# Patient Record
Sex: Female | Born: 2000 | Race: White | Hispanic: No | Marital: Single | Smoking: Never smoker
Health system: Southern US, Community
[De-identification: ages and names within clinical notes are randomized; demographics above are authoritative.]

## PROBLEM LIST (undated history)

## (undated) DIAGNOSIS — L709 Acne, unspecified: Secondary | ICD-10-CM

## (undated) DIAGNOSIS — K5900 Constipation, unspecified: Secondary | ICD-10-CM

## (undated) DIAGNOSIS — K644 Residual hemorrhoidal skin tags: Secondary | ICD-10-CM

## (undated) HISTORY — DX: Residual hemorrhoidal skin tags: K64.4

## (undated) HISTORY — DX: Acne, unspecified: L70.9

## (undated) HISTORY — DX: Constipation, unspecified: K59.00

## (undated) HISTORY — PX: TONSILLECTOMY AND ADENOIDECTOMY: SHX28

## (undated) HISTORY — PX: GUM SURGERY: SHX658

---
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
## 2018-08-08 ENCOUNTER — Telehealth: Payer: Self-pay | Admitting: Family Medicine

## 2018-08-08 NOTE — Telephone Encounter (Signed)
She is NOT up to date on vaccines. Please schedule physical in office as long as she does not have cold symptoms.

## 2018-08-08 NOTE — Telephone Encounter (Signed)
Patients mom called to see if patient is due any immunizations at this time. Can you advise and I will give her a call back to let her know.  Thank You

## 2018-08-08 NOTE — Telephone Encounter (Signed)
Per patient no cold symptoms and not around anyone as far as she knows with COVID 19  Scheduled 08-14-18 @ 2:00 with Dr Laural Benes

## 2018-08-13 ENCOUNTER — Other Ambulatory Visit: Payer: Self-pay | Admitting: Family Medicine

## 2018-08-14 ENCOUNTER — Other Ambulatory Visit: Payer: Self-pay

## 2018-08-14 ENCOUNTER — Ambulatory Visit (INDEPENDENT_AMBULATORY_CARE_PROVIDER_SITE_OTHER): Payer: BC Managed Care – PPO | Admitting: Family Medicine

## 2018-08-14 ENCOUNTER — Encounter: Payer: Self-pay | Admitting: Family Medicine

## 2018-08-14 VITALS — BP 110/74 | HR 80 | Temp 99.5°F | Ht 67.4 in | Wt 119.8 lb

## 2018-08-14 DIAGNOSIS — Z0000 Encounter for general adult medical examination without abnormal findings: Secondary | ICD-10-CM

## 2018-08-14 DIAGNOSIS — Z23 Encounter for immunization: Secondary | ICD-10-CM | POA: Diagnosis not present

## 2018-08-14 NOTE — Patient Instructions (Addendum)
Tdap Vaccine (Tetanus, Diphtheria and Pertussis): What You Need to Know 1. Why get vaccinated? Tetanus, diphtheria and pertussis are very serious diseases. Tdap vaccine can protect [place] from these diseases. And, Tdap vaccine given to pregnant women can protect newborn babies against pertussis.Marland Kitchen TETANUS (Lockjaw) is rare in the [place] today. It causes painful muscle tightening and stiffness, usually all over the body.  It can lead to tightening of muscles in the head and neck so you can't open your mouth, swallow, or sometimes even breathe. Tetanus kills about 1 out of 10 people who are infected even after receiving the best medical care. DIPHTHERIA is also rare in the [place] today. It can cause a thick coating to form in the back of the throat.  It can lead to breathing problems, heart failure, paralysis, and death. PERTUSSIS (Whooping Cough) causes severe coughing spells, which can cause difficulty breathing, vomiting and disturbed sleep.  It can also lead to weight loss, incontinence, and rib fractures. Up to 2 in 100 adolescents and 5 in 100 adults with pertussis are hospitalized or have complications, which could include pneumonia or death. These diseases are caused by bacteria. Diphtheria and pertussis are spread from person to person through secretions from coughing or sneezing. Tetanus enters the body through cuts, scratches, or wounds. Before vaccines, as many as 200,000 cases of diphtheria, 200,000 cases of pertussis, and hundreds of cases of tetanus, were reported in the [place] each year. Since vaccination began, reports of cases for tetanus and diphtheria have dropped by about 99% and for pertussis by about 80%. 2. Tdap vaccine Tdap vaccine can protect adolescents and adults from tetanus, diphtheria, and pertussis. One dose of Tdap is routinely given at age 61 or 77. People who did not get Tdap at that age should get it as soon as possible. Tdap is especially important  for healthcare professionals and anyone having close contact with a baby younger than 12 months. Pregnant women should get a dose of Tdap during every pregnancy, to protect the newborn from pertussis. Infants are most at risk for severe, life-threatening complications from pertussis. Another vaccine, called Td, protects against tetanus and diphtheria, but not pertussis. A Td booster should be given every 10 years. Tdap may be given as one of these boosters if you have never gotten Tdap before. Tdap may also be given after a severe cut or burn to prevent tetanus infection. Your doctor or the person giving you the vaccine can give you more information. Tdap may safely be given at the same time as other vaccines. 3. Some people should not get this vaccine  A person who has ever had a life-threatening allergic reaction after a previous dose of any diphtheria, tetanus or pertussis containing vaccine, OR has a severe allergy to any part of this vaccine, should not get Tdap vaccine. Tell the person giving the vaccine about any severe allergies.  Anyone who had coma or long repeated seizures within 7 days after a childhood dose of DTP or DTaP, or a previous dose of Tdap, should not get Tdap, unless a cause other than the vaccine was found. They can still get Td.  Talk to your doctor if you: ? have seizures or another nervous system problem, ? had severe pain or swelling after any vaccine containing diphtheria, tetanus or pertussis, ? ever had a condition called Guillain-Barr Syndrome (GBS), ? aren't feeling well on the day the shot is scheduled. 4. Risks With any medicine, including vaccines, there is  a chance of side effects. These are usually mild and go away on their own. Serious reactions are also possible but are rare. Most people who get Tdap vaccine do not have any problems with it. Mild problems following Tdap (Did not interfere with activities)  Pain where the shot was given (about 3 in 4  adolescents or 2 in 3 adults)  Redness or swelling where the shot was given (about 1 person in 5)  Mild fever of at least 100.40F (up to about 1 in 25 adolescents or 1 in 100 adults)  Headache (about 3 or 4 people in 10)  Tiredness (about 1 person in 3 or 4)  Nausea, vomiting, diarrhea, stomach ache (up to 1 in 4 adolescents or 1 in 10 adults)  Chills, sore joints (about 1 person in 10)  Body aches (about 1 person in 3 or 4)  Rash, swollen glands (uncommon) Moderate problems following Tdap (Interfered with activities, but did not require medical attention)  Pain where the shot was given (up to 1 in 5 or 6)  Redness or swelling where the shot was given (up to about 1 in 16 adolescents or 1 in 12 adults)  Fever over 102F (about 1 in 100 adolescents or 1 in 250 adults)  Headache (about 1 in 7 adolescents or 1 in 10 adults)  Nausea, vomiting, diarrhea, stomach ache (up to 1 or 3 people in 100)  Swelling of the entire arm where the shot was given (up to about 1 in 500). Severe problems following Tdap (Unable to perform usual activities; required medical attention)  Swelling, severe pain, bleeding and redness in the arm where the shot was given (rare). Problems that could happen after any vaccine:  People sometimes faint after a medical procedure, including vaccination. Sitting or lying down for about 15 minutes can help prevent fainting, and injuries caused by a fall. Tell your doctor if you feel dizzy, or have vision changes or ringing in the ears.  Some people get severe pain in the shoulder and have difficulty moving the arm where a shot was given. This happens very rarely.  Any medication can cause a severe allergic reaction. Such reactions from a vaccine are very rare, estimated at fewer than 1 in a million doses, and would happen within a few minutes to a few hours after the vaccination. As with any medicine, there is a very remote chance of a vaccine causing a serious  injury or death. The safety of vaccines is always being monitored. For more information, visit: http://www.aguilar.org/ 5. What if there is a serious problem? What should I look for?  Look for anything that concerns you, such as signs of a severe allergic reaction, very high fever, or unusual behavior. Signs of a severe allergic reaction can include hives, swelling of the face and throat, difficulty breathing, a fast heartbeat, dizziness, and weakness. These would usually start a few minutes to a few hours after the vaccination. What should I do?  If you think it is a severe allergic reaction or other emergency that can't wait, call 9-1-1 or get the person to the nearest hospital. Otherwise, call your doctor.  Afterward, the reaction should be reported to the Vaccine Adverse Event Reporting System (VAERS). Your doctor might file this report, or you can do it yourself through the VAERS web site at www.vaers.SamedayNews.es, or by calling 302-685-4097. VAERS does not give medical advice. 6. The National Vaccine Injury Compensation Program The [employer] Vaccine Injury Compensation Program ([place]) is  a federal program that was created to compensate people who may have been injured by certain vaccines. Persons who believe they may have been injured by a vaccine can learn about the program and about filing a claim by calling (347)592-6592 or visiting the [place] website at GoldCloset.com.ee. There is a time limit to file a claim for compensation. 7. How can I learn more?  Ask your doctor. He or she can give you the vaccine package insert or suggest other sources of information.  Call your local or state health department.  Contact the Centers for Disease Control and Prevention (CDC): ? Call 705-124-9657 (1-800-CDC-INFO) or ? Visit CDC's website at http://hunter.com/ Vaccine Information Statement Tdap Vaccine (07/02/2013) This information is not intended to replace advice given to you  by your health care provider. Make sure you discuss any questions you have with your health care provider. Document Released: 10/25/2011 Document Revised: 12/11/2017 Document Reviewed: 12/11/2017 Elsevier Interactive Patient Education  2019 [place]. Hepatitis A Vaccine: What You Need to Know 1. Why get vaccinated? Hepatitis A is a serious liver disease. It is caused by the hepatitis A virus (HAV). HAV is spread from person to person through contact with the feces (stool) of people who are infected, which can easily happen if someone does not wash his or her hands properly. You can also get hepatitis A from food, water, or objects contaminated with HAV. Symptoms of hepatitis A can include:  fever, fatigue, loss of appetite, nausea, vomiting, and/or joint pain  severe stomach pains and diarrhea (mainly in children), or  jaundice (yellow skin or eyes, dark urine, clay-colored bowel movements). These symptoms usually appear 2 to 6 weeks after exposure and usually last less than 2 months, although some people can be ill for as long as 6 months. If you have hepatitis A you may be too ill to work. Children often do not have symptoms, but most adults do. You can spread HAV without having symptoms. Hepatitis A can cause liver failure and death, although this is rare and occurs more commonly in persons 1 years of age or older and persons with other liver diseases, such as hepatitis B or C. Hepatitis A vaccine can prevent hepatitis A. Hepatitis A vaccines were recommended in the [place] beginning in 1996. Since then, the number of cases reported each year in the [place] has dropped from around 31,000 cases to fewer than 1,500 cases. 2. Hepatitis A vaccine Hepatitis A vaccine is an inactivated (killed) vaccine. You will need 2 doses for long-lasting protection. These doses should be given at least 6 months apart. Children are routinely vaccinated between their first and second birthdays (67 through  83 months of age). Older children and adolescents can get the vaccine after 23 months. Adults who have not been vaccinated previously and want to be protected against hepatitis A can also get the vaccine. You should get hepatitis A vaccine if you:  are traveling to countries where hepatitis A is common,  are a man who has sex with other men,  use illegal drugs,  have a chronic liver disease such as hepatitis B or hepatitis C,  are being treated with clotting-factor concentrates,  work with hepatitis A-infected animals or in a hepatitis A research laboratory, or  expect to have close personal contact with an international adoptee from a country where hepatitis A is common Ask your healthcare provider if you want more information about any of these groups. There are no known risks to getting hepatitis  A vaccine at the same time as other vaccines. 3. Some people should not get this vaccine Tell the person who is giving you the vaccine:  If you have any severe, life-threatening allergies. If you ever had a life-threatening allergic reaction after a dose of hepatitis A vaccine, or have a severe allergy to any part of this vaccine, you may be advised not to get vaccinated. Ask your health care provider if you want information about vaccine components.  If you are not feeling well. If you have a mild illness, such as a cold, you can probably get the vaccine today. If you are moderately or severely ill, you should probably wait until you recover. Your doctor can advise you. 4. Risks of a vaccine reaction With any medicine, including vaccines, there is a chance of side effects. These are usually mild and go away on their own, but serious reactions are also possible. Most people who get hepatitis A vaccine do not have any problems with it. Minor problems following hepatitis A vaccine include:  soreness or redness where the shot was given  low-grade fever  headache  tiredness If these  problems occur, they usually begin soon after the shot and last 1 or 2 days. Your doctor can tell you more about these reactions. Other problems that could happen after this vaccine:  People sometimes faint after a medical procedure, including vaccination. Sitting or lying down for about 15 minutes can help prevent fainting, and injuries caused by a fall. Tell your provider if you feel dizzy, or have vision changes or ringing in the ears.  Some people get shoulder pain that can be more severe and longer lasting than the more routine soreness that can follow injections. This happens very rarely.  Any medication can cause a severe allergic reaction. Such reactions from a vaccine are very rare, estimated at about 1 in a million doses, and would happen within a few minutes to a few hours after the vaccination. As with any medicine, there is a very remote chance of a vaccine causing a serious injury or death. The safety of vaccines is always being monitored. For more information, visit: http://www.aguilar.org/ 5. What if there is a serious problem? What should I look for?  Look for anything that concerns you, such as signs of a severe allergic reaction, very high fever, or unusual behavior. Signs of a severe allergic reaction can include hives, swelling of the face and throat, difficulty breathing, a fast heartbeat, dizziness, and weakness. These would start a few minutes to a few hours after the vaccination. What should I do?  If you think it is a severe allergic reaction or other emergency that can't wait, call 9-1-1 or get to the nearest hospital. Otherwise, call your clinic. Afterward, the reaction should be reported to the Vaccine Adverse Event Reporting System (VAERS). Your doctor should file this report, or you can do it yourself through the VAERS web site at www.vaers.SamedayNews.es, or by calling 347-414-2868. VAERS does not give medical advice. 6. The National Vaccine Injury Compensation  Program The [employer] Vaccine Injury Compensation Program (VICP) is a federal program that was created to compensate people who may have been injured by certain vaccines. Persons who believe they may have been injured by a vaccine can learn about the program and about filing a claim by calling (973)832-3173 or visiting the [place] website at GoldCloset.com.ee. There is a time limit to file a claim for compensation. 7. How can I learn more?  Ask your  healthcare provider. He or she can give you the vaccine package insert or suggest other sources of information.  Call your local or state health department.  Contact the Centers for Disease Control and Prevention (CDC): ? Call 845-818-6322 (1-800-CDC-INFO) or ? Visit CDC's website at http://hunter.com/ CDC Vaccine Information Statement Hepatitis A Vaccine (11/26/2014) This information is not intended to replace advice given to you by your health care provider. Make sure you discuss any questions you have with your health care provider. Document Released: 02/17/2006 Document Revised: 12/05/2017 Document Reviewed: 12/05/2017 Elsevier Interactive Patient Education  2019 [place]. Varicella (Chickenpox) Vaccine: What You Need to Know 1. Why get vaccinated? Varicella vaccine can prevent chickenpox. Chickenpox can cause an itchy rash that usually lasts about a week. It can also cause fever, tiredness, loss of appetite, and headache. It can lead to skin infections, pneumonia, inflammation of the blood vessels, and swelling of the brain and/or spinal cord covering, and infections of the bloodstream, bone, or joints. Some people who get chickenpox get a painful rash called shingles (also known as herpes zoster) years later. Chickenpox is usually mild but it can be serious in infants under 10 months of age, adolescents, adults, pregnant women, and people with a weakened immune system. Some people get so sick that they need to be  hospitalized. It doesn't happen often, but people can die from chickenpox. Most people who are vaccinated with 2 doses of varicella vaccine will be protected for life. 2. Varicella vaccine Children need 2 doses of varicella vaccine, usually:  First dose: 12 through 11 months of age  Second dose: 4 through 30 years of age Older children, adolescents, and adults also need 2 doses of varicella vaccine if they are not already immune to chickenpox. Varicella vaccine may be given at the same time as other vaccines. Also, a child between 53 months and 95 years of age might receive varicella vaccine together with MMR (measles, mumps, and rubella) vaccine in a single shot, known as MMRV. Your health care provider can give you more information. 3. Talk with your health care provider Tell your vaccine provider if the person getting the vaccine:  Has had an allergic reaction after a previous dose of varicella vaccine, or has any severe, life-threatening allergies.  Is pregnant, or thinks she might be pregnant.  Has a weakened immune system, or has a parent, brother, or sister with a history of hereditary or congenital immune system problems.  Is taking salicylates (such as aspirin).  Has recently had a blood transfusion or received other blood products.  Has tuberculosis.  Has gotten any other vaccines in the past 4 weeks. In some cases, your health care provider may decide to postpone varicella vaccination to a future visit. People with minor illnesses, such as a cold, may be vaccinated. People who are moderately or severely ill should usually wait until they recover before getting varicella vaccine. Your health care provider can give you more information. 4. Risks of a vaccine reaction  Sore arm from the injection, fever, or redness or rash where the shot is given can happen after varicella vaccine.  More serious reactions happen very rarely. These can include pneumonia, infection of the brain  and/or spinal cord covering, or seizures that are often associated with fever.  In people with serious immune system problems, this vaccine may cause an infection which may be life-threatening. People with serious immune system problems should not get varicella vaccine. It is possible for a vaccinated person to  develop a rash. If this happens, the varicella vaccine virus could be spread to an unprotected person. Anyone who gets a rash should stay away from people with a weakened immune system and infants until the rash goes away. Talk with your health care provider to learn more. Some people who are vaccinated against chickenpox get shingles (herpes zoster) years later. This is much less common after vaccination than after chickenpox disease. People sometimes faint after medical procedures, including vaccination. Tell your provider if you feel dizzy or have vision changes or ringing in the ears. As with any medicine, there is a very remote chance of a vaccine causing a severe allergic reaction, other serious injury, or death. 5. What if there is a serious problem? An allergic reaction could occur after the vaccinated person leaves the clinic. If you see signs of a severe allergic reaction (hives, swelling of the face and throat, difficulty breathing, a fast heartbeat, dizziness, or weakness), call 9-1-1 and get the person to the nearest hospital. For other signs that concern you, call your health care provider. Adverse reactions should be reported to the Vaccine Adverse Event Reporting System (VAERS). Your health care provider will usually file this report, or you can do it yourself. Visit the VAERS website at www.vaers.SamedayNews.es or call (360) 298-1357.VAERS is only for reporting reactions, and VAERS staff do not give medical advice. 6. The National Vaccine Injury Compensation Program The [employer] Vaccine Injury Compensation Program (VICP) is a federal program that was created to compensate people who may  have been injured by certain vaccines. Visit the VICP website at GoldCloset.com.ee or call 626-214-1748 to learn about the program and about filing a claim. There is a time limit to file a claim for compensation. 7. How can I learn more?  Ask your healthcare provider.  Call your local or state health department.  Contact the Centers for Disease Control and Prevention (CDC): ? Call 682-112-0671 (1-800-CDC-INFO) or ? Visit CDC's http://hunter.com/ Vaccine Information Statement (Interim) Varicella Vaccine (12/21/2017) This information is not intended to replace advice given to you by your health care provider. Make sure you discuss any questions you have with your health care provider. Document Released: 02/17/2006 Document Revised: 12/27/2017 Document Reviewed: 12/27/2017 Elsevier Interactive Patient Education  2019 [employer] Inc. HPV (Human Papillomavirus) Vaccine: What You Need to Know 1. Why get vaccinated? HPV vaccine prevents infection with human papillomavirus (HPV) types that are associated with many cancers, including:  cervical cancer in females,  vaginal and vulvar cancers in females,  anal cancer in females and males,  throat cancer in females and males, and  penile cancer in males. In addition, HPV vaccine prevents infection with HPV types that cause genital warts in both females and males. In the [place] about 12,000 women get cervical cancer every year, and about 4,000 women die from it. HPV vaccine can prevent most of these cases of cervical cancer. Vaccination is not a substitute for cervical cancer screening. This vaccine does not protect against all HPV types that can cause cervical cancer. Women should still get regular Pap tests. HPV infection usually comes from sexual contact, and most people will become infected at some point in their life. About 14 million Americans, including teens, get infected every year. Most infections will go away on their  own and not cause serious problems. But thousands of women and men get cancer and other diseases from HPV. 2. HPV vaccine HPV vaccine is approved by FDA and is recommended by CDC for both males and  females. It is routinely given at 92 or 20 years of age, but it may be given beginning at age 71 years through age 51 years. Most adolescents 9 through 20 years of age should get HPV vaccine as a two-dose series with the doses separated by 6-12 months. People who start HPV vaccination at 37 years of age and older should get the vaccine as a three-dose series with the second dose given 1-2 months after the first dose and the third dose given 6 months after the first dose. There are several exceptions to these age recommendations. Your health care provider can give you more information. 3. Some people should not get this vaccine  Anyone who has had a severe (life-threatening) allergic reaction to a dose of HPV vaccine should not get another dose.  Anyone who has a severe (life threatening) allergy to any component of HPV vaccine should not get the vaccine. ? Tell your doctor if you have any severe allergies that you know of, including a severe allergy to yeast.  HPV vaccine is not recommended for pregnant women. If you learn that you were pregnant when you were vaccinated, there is no reason to expect any problems for you or your baby. Any woman who learns she was pregnant when she got HPV vaccine is encouraged to contact the manufacturer's registry for HPV vaccination during pregnancy at (737) 519-8909. Women who are breastfeeding may be vaccinated.  If you have a mild illness, such as a cold, you can probably get the vaccine today. If you are moderately or severely ill, you should probably wait until you recover. Your doctor can advise you. 4. Risks of a vaccine reaction With any medicine, including vaccines, there is a chance of side effects. These are usually mild and go away on their own, but serious  reactions are also possible. Most people who get HPV vaccine do not have any serious problems with it. Mild or moderate problems following HPV vaccine:  Reactions in the arm where the shot was given: ? Soreness (about 9 people in 10) ? Redness or swelling (about 1 person in 3)  Fever: ? Mild (100F) (about 1 person in 10) ? Moderate (102F) (about 1 person in 74)  Other problems: ? Headache (about 1 person in 3) Problems that could happen after any injected vaccine:  People sometimes faint after a medical procedure, including vaccination. Sitting or lying down for about 15 minutes can help prevent fainting, and injuries caused by a fall. Tell your doctor if you feel dizzy, or have vision changes or ringing in the ears.  Some people get severe pain in the shoulder and have difficulty moving the arm where a shot was given. This happens very rarely.  Any medication can cause a severe allergic reaction. Such reactions from a vaccine are very rare, estimated at about 1 in a million doses, and would happen within a few minutes to a few hours after the vaccination. As with any medicine, there is a very remote chance of a vaccine causing a serious injury or death. The safety of vaccines is always being monitored. For more information, visit: http://www.aguilar.org/. 5. What if there is a serious reaction? What should I look for? Look for anything that concerns you, such as signs of a severe allergic reaction, very high fever, or unusual behavior. Signs of a severe allergic reaction can include hives, swelling of the face and throat, difficulty breathing, a fast heartbeat, dizziness, and weakness. These would usually start a few minutes  to a few hours after the vaccination. What should I do? If you think it is a severe allergic reaction or other emergency that can't wait, call 9-1-1 or get to the nearest hospital. Otherwise, call your doctor. Afterward, the reaction should be reported to the  Vaccine Adverse Event Reporting System (VAERS). Your doctor should file this report, or you can do it yourself through the VAERS web site at www.vaers.SamedayNews.es, or by calling 747-521-7089. VAERS does not give medical advice. 6. The National Vaccine Injury Compensation Program The [employer] Vaccine Injury Compensation Program (VICP) is a federal program that was created to compensate people who may have been injured by certain vaccines. Persons who believe they may have been injured by a vaccine can learn about the program and about filing a claim by calling 469 193 8507 or visiting the [place] website at GoldCloset.com.ee. There is a time limit to file a claim for compensation. 7. How can I learn more?  Ask your health care provider. He or she can give you the vaccine package insert or suggest other sources of information.  Call your local or state health department.  Contact the Centers for Disease Control and Prevention (CDC): ? Call 732-689-0691 (1-800-CDC-INFO) or ? Visit CDC's website at http://sweeney-todd.com/ Vaccine Information Statement HPV Vaccine (04/10/2015) This information is not intended to replace advice given to you by your health care provider. Make sure you discuss any questions you have with your health care provider. Document Released: 11/20/2013 Document Revised: 12/05/2017 Document Reviewed: 12/05/2017 Elsevier Interactive Patient Education  2019 [place]. Meningococcal B Vaccine: What You Need to Know 1. Why get vaccinated? Meningococcal B vaccine can help protect against meningococcal disease caused by serogroup B. A different meningococcal vaccine is available that can help protect against serogroups A, C, W, and Y. Meningococcal disease can cause meningitis (infection of the lining of the brain and spinal cord) and infections of the blood. Even when it is treated, meningococcal disease kills 10 to 15 infected people out of 100. And of those who survive,  about 10 to 20 out of every 100 will suffer disabilities such as hearing loss, brain damage, kidney damage, loss of limbs, nervous system problems, or severe scars from skin grafts. Anyone can get meningococcal disease but certain people are at increased risk, including:  Infants younger than one year old  Adolescents and young adults 8 through 20 years old  People with certain medical conditions that affect the immune system  Microbiologists who routinely work with isolates of N. meningitidis, the bacteria that cause meningococcal disease  People at risk because of an outbreak in their community 2. Meningococcal B vaccine For best protection, more than 1 dose of a meningococcal B vaccine is needed. There are two meningococcal B vaccines available. The same vaccine must be used for all doses. Meningococcal B vaccines are recommended for people 10 years or older who are at increased risk for serogroup B meningococcal disease, including:  People at risk because of a serogroup B meningococcal disease outbreak  Anyone whose spleen is damaged or has been removed, including people with sickle cell disease  Anyone with a rare immune system condition called "persistent complement component deficiency"  Anyone taking a type of drug called a complement inhibitor, such as eculizumab (also called Soliris) or ravulizumab (also called Ultomiris)  Microbiologists who routinely work with isolates of N. meningitidis These vaccines may also be given to anyone 51 through 19 years old to provide short-term protection against most strains of serogroup B  meningococcal disease; 16 through 18 years are the preferred ages for vaccination. 3. Talk with your health care provider Tell your vaccine provider if the person getting the vaccine:  Has had an allergic reaction after a previous dose of meningococcal B vaccine, or has any severe, life-threatening allergies.  Is pregnant or breastfeeding. In some  cases, your health care provider may decide to postpone meningococcal B vaccination to a future visit. People with minor illnesses, such as a cold, may be vaccinated. People who are moderately or severely ill should usually wait until they recover before getting meningococcal B vaccine. Your health care provider can give you more information. 4. Risks of a vaccine reaction  Soreness, redness, or swelling where the shot is given, tiredness, fatigue, headache, muscle or joint pain, fever, chills, nausea, or diarrhea can happen after meningococcal B vaccine. Some of these reactions occur in more than half of the people who receive the vaccine. People sometimes faint after medical procedures, including vaccination. Tell your provider if you feel dizzy or have vision changes or ringing in the ears. As with any medicine, there is a very remote chance of a vaccine causing a severe allergic reaction, other serious injury, or death. 5. What if there is a serious problem? An allergic reaction could occur after the vaccinated person leaves the clinic. If you see signs of a severe allergic reaction (hives, swelling of the face and throat, difficulty breathing, a fast heartbeat, dizziness, or weakness), call 9-1-1 and get the person to the nearest hospital. For other signs that concern you, call your health care provider. Adverse reactions should be reported to the Vaccine Adverse Event Reporting System (VAERS). Your health care provider will usually file this report, or you can do it yourself. Visit the VAERS website at www.vaers.SamedayNews.es or call 479 070 2349.VAERS is only for reporting reactions, and VAERS staff do not give medical advice. 6. The National Vaccine Injury Compensation Program The [employer] Vaccine Injury Compensation Program (VICP) is a federal program that was created to compensate people who may have been injured by certain vaccines. Visit the VICP website at GoldCloset.com.ee or  call (340) 365-5413 to learn about the program and about filing a claim. There is a time limit to file a claim for compensation. 7. How can I learn more?  Ask your healthcare provider.  Call your local or state health department.  Contact the Centers for Disease Control and Prevention (CDC): ? Call 502-394-7689 (1-800-CDC-INFO) or ? Visit CDC's http://hunter.com/ Vaccine Information Statement (Interim) Meningococcal B Vaccine (12/21/2017) This information is not intended to replace advice given to you by your health care provider. Make sure you discuss any questions you have with your health care provider. Document Released: 12/27/2013 Document Revised: 12/25/2017 Document Reviewed: 12/25/2017 Elsevier Interactive Patient Education  2019 [place] Maintenance, Female Adopting a healthy lifestyle and getting preventive care can go a long way to promote health and wellness. Talk with your health care provider about what schedule of regular examinations is right for you. This is a good chance for you to check in with your provider about disease prevention and staying healthy. In between checkups, there are plenty of things you can do on your own. Experts have done a lot of research about which lifestyle changes and preventive measures are most likely to keep you healthy. Ask your health care provider for more information. Weight and diet Eat a healthy diet  Be sure to include plenty of vegetables, fruits, low-fat dairy products, and lean protein.  Do not eat a lot of foods high in solid fats, added sugars, or salt.  Get regular exercise. This is one of the most important things you can do for your health. ? Most adults should exercise for at least 150 minutes each week. The exercise should increase your heart rate and make you sweat (moderate-intensity exercise). ? Most adults should also do strengthening exercises at least twice a week. This is in addition to the  moderate-intensity exercise. Maintain a healthy weight  Body mass index (BMI) is a measurement that can be used to identify possible weight problems. It estimates body fat based on height and weight. Your health care provider can help determine your BMI and help you achieve or maintain a healthy weight.  For females 50 years of age and older: ? A BMI below 18.5 is considered underweight. ? A BMI of 18.5 to 24.9 is normal. ? A BMI of 25 to 29.9 is considered overweight. ? A BMI of 30 and above is considered obese. Watch levels of cholesterol and blood lipids  You should start having your blood tested for lipids and cholesterol at 20 years of age, then have this test every 5 years.  You may need to have your cholesterol levels checked more often if: ? Your lipid or cholesterol levels are high. ? You are older than 20 years of age. ? You are at high risk for heart disease. Cancer screening Lung Cancer  Lung cancer screening is recommended for adults 43-11 years old who are at high risk for lung cancer because of a history of smoking.  A yearly low-dose CT scan of the lungs is recommended for people who: ? Currently smoke. ? Have quit within the past 15 years. ? Have at least a 30-pack-year history of smoking. A pack year is smoking an average of one pack of cigarettes a day for 1 year.  Yearly screening should continue until it has been 15 years since you quit.  Yearly screening should stop if you develop a health problem that would prevent you from having lung cancer treatment. Breast Cancer  Practice breast self-awareness. This means understanding how your breasts normally appear and feel.  It also means doing regular breast self-exams. Let your health care provider know about any changes, no matter how small.  If you are in your 20s or 30s, you should have a clinical breast exam (CBE) by a health care provider every 1-3 years as part of a regular health exam.  If you are 29 or  older, have a CBE every year. Also consider having a breast X-ray (mammogram) every year.  If you have a family history of breast cancer, talk to your health care provider about genetic screening.  If you are at high risk for breast cancer, talk to your health care provider about having an MRI and a mammogram every year.  Breast cancer gene (BRCA) assessment is recommended for women who have family members with BRCA-related cancers. BRCA-related cancers include: ? Breast. ? Ovarian. ? Tubal. ? Peritoneal cancers.  Results of the assessment will determine the need for genetic counseling and BRCA1 and BRCA2 testing. Cervical Cancer Your health care provider may recommend that you be screened regularly for cancer of the pelvic organs (ovaries, uterus, and vagina). This screening involves a pelvic examination, including checking for microscopic changes to the surface of your cervix (Pap test). You may be encouraged to have this screening done every 3 years, beginning at age 29.  For women  ages 13-65, health care providers may recommend pelvic exams and Pap testing every 3 years, or they may recommend the Pap and pelvic exam, combined with testing for human papilloma virus (HPV), every 5 years. Some types of HPV increase your risk of cervical cancer. Testing for HPV may also be done on women of any age with unclear Pap test results.  Other health care providers may not recommend any screening for nonpregnant women who are considered low risk for pelvic cancer and who do not have symptoms. Ask your health care provider if a screening pelvic exam is right for you.  If you have had past treatment for cervical cancer or a condition that could lead to cancer, you need Pap tests and screening for cancer for at least 20 years after your treatment. If Pap tests have been discontinued, your risk factors (such as having a new sexual partner) need to be reassessed to determine if screening should resume. Some  women have medical problems that increase the chance of getting cervical cancer. In these cases, your health care provider may recommend more frequent screening and Pap tests. Colorectal Cancer  This type of cancer can be detected and often prevented.  Routine colorectal cancer screening usually begins at 20 years of age and continues through 20 years of age.  Your health care provider may recommend screening at an earlier age if you have risk factors for colon cancer.  Your health care provider may also recommend using home test kits to check for hidden blood in the stool.  A small camera at the end of a tube can be used to examine your colon directly (sigmoidoscopy or colonoscopy). This is done to check for the earliest forms of colorectal cancer.  Routine screening usually begins at age 61.  Direct examination of the colon should be repeated every 5-10 years through 20 years of age. However, you may need to be screened more often if early forms of precancerous polyps or small growths are found. Skin Cancer  Check your skin from head to toe regularly.  Tell your health care provider about any new moles or changes in moles, especially if there is a change in a mole's shape or color.  Also tell your health care provider if you have a mole that is larger than the size of a pencil eraser.  Always use sunscreen. Apply sunscreen liberally and repeatedly throughout the day.  Protect yourself by wearing long sleeves, pants, a wide-brimmed hat, and sunglasses whenever you are outside. Heart disease, diabetes, and high blood pressure  High blood pressure causes heart disease and increases the risk of stroke. High blood pressure is more likely to develop in: ? People who have blood pressure in the high end of the normal range (130-139/85-89 mm Hg). ? People who are overweight or obese. ? People who are African American.  If you are 1-65 years of age, have your blood pressure checked every  3-5 years. If you are 28 years of age or older, have your blood pressure checked every year. You should have your blood pressure measured twice-once when you are at a hospital or clinic, and once when you are not at a hospital or clinic. Record the average of the two measurements. To check your blood pressure when you are not at a hospital or clinic, you can use: ? An automated blood pressure machine at a pharmacy. ? A home blood pressure monitor.  If you are between 43 years and 13 years old, ask  your health care provider if you should take aspirin to prevent strokes.  Have regular diabetes screenings. This involves taking a blood sample to check your fasting blood sugar level. ? If you are at a normal weight and have a low risk for diabetes, have this test once every three years after 20 years of age. ? If you are overweight and have a high risk for diabetes, consider being tested at a younger age or more often. Preventing infection Hepatitis B  If you have a higher risk for hepatitis B, you should be screened for this virus. You are considered at high risk for hepatitis B if: ? You were born in a country where hepatitis B is common. Ask your health care provider which countries are considered high risk. ? Your parents were born in a high-risk country, and you have not been immunized against hepatitis B (hepatitis B vaccine). ? You have HIV or AIDS. ? You use needles to inject street drugs. ? You live with someone who has hepatitis B. ? You have had sex with someone who has hepatitis B. ? You get hemodialysis treatment. ? You take certain medicines for conditions, including cancer, organ transplantation, and autoimmune conditions. Hepatitis C  Blood testing is recommended for: ? Everyone born from [birth year] through 1967. ? Anyone with known risk factors for hepatitis C. Sexually transmitted infections (STIs)  You should be screened for sexually transmitted infections (STIs) including  gonorrhea and chlamydia if: ? You are sexually active and are younger than 20 years of age. ? You are older than 20 years of age and your health care provider tells you that you are at risk for this type of infection. ? Your sexual activity has changed since you were last screened and you are at an increased risk for chlamydia or gonorrhea. Ask your health care provider if you are at risk.  If you do not have HIV, but are at risk, it may be recommended that you take a prescription medicine daily to prevent HIV infection. This is called pre-exposure prophylaxis (PrEP). You are considered at risk if: ? You are sexually active and do not regularly use condoms or know the HIV status of your partner(s). ? You take drugs by injection. ? You are sexually active with a partner who has HIV. Talk with your health care provider about whether you are at high risk of being infected with HIV. If you choose to begin PrEP, you should first be tested for HIV. You should then be tested every 3 months for as long as you are taking PrEP. Pregnancy  If you are premenopausal and you may become pregnant, ask your health care provider about preconception counseling.  If you may become pregnant, take 400 to 800 micrograms (mcg) of folic acid every day.  If you want to prevent pregnancy, talk to your health care provider about birth control (contraception). Osteoporosis and menopause  Osteoporosis is a disease in which the bones lose minerals and strength with aging. This can result in serious bone fractures. Your risk for osteoporosis can be identified using a bone density scan.  If you are 71 years of age or older, or if you are at risk for osteoporosis and fractures, ask your health care provider if you should be screened.  Ask your health care provider whether you should take a calcium or vitamin D supplement to lower your risk for osteoporosis.  Menopause may have certain physical symptoms and risks.  Hormone  replacement therapy may  reduce some of these symptoms and risks. Talk to your health care provider about whether hormone replacement therapy is right for you. Follow these instructions at home:  Schedule regular health, dental, and eye exams.  Stay current with your immunizations.  Do not use any tobacco products including cigarettes, chewing tobacco, or electronic cigarettes.  If you are pregnant, do not drink alcohol.  If you are breastfeeding, limit how much and how often you drink alcohol.  Limit alcohol intake to no more than 1 drink per day for nonpregnant women. One drink equals 12 ounces of beer, 5 ounces of wine, or 1 ounces of hard liquor.  Do not use street drugs.  Do not share needles.  Ask your health care provider for help if you need support or information about quitting drugs.  Tell your health care provider if you often feel depressed.  Tell your health care provider if you have ever been abused or do not feel safe at home. This information is not intended to replace advice given to you by your health care provider. Make sure you discuss any questions you have with your health care provider. Document Released: 11/08/2010 Document Revised: 10/01/2015 Document Reviewed: 01/27/2015 Elsevier Interactive Patient Education  2019 [employer].

## 2018-08-14 NOTE — Progress Notes (Signed)
BP 110/74   Pulse 80   Temp 99.5 F (37.5 C) (Oral)   Ht 5' 7.4" (1.712 m)   Wt 119 lb 12.8 oz (54.3 kg)   LMP 07/18/2018   SpO2 99%   BMI 18.54 kg/m    Subjective:    Patient ID: Marie Park, female    DOB: February 12, 2001, 18 y.o.   MRN: [id number]  HPI: Marie Park is a 18 y.o. female presenting on 08/14/2018 for comprehensive medical examination. Current medical complaints include:none  She currently lives with: parents Menopausal Symptoms: no  Depression Screen done today and results listed below:  Depression screen [facility] 2/9 08/14/2018  Decreased Interest 0  Down, Depressed, Hopeless 0  PHQ - 2 Score 0  Altered sleeping 0  Tired, decreased energy 0  Change in appetite 0  Feeling bad or failure about yourself  0  Trouble concentrating 0  Moving slowly or fidgety/restless 0  Suicidal thoughts 0  PHQ-9 Score 0  Difficult doing work/chores Not difficult at all    Past Medical History:  Past Medical History:  Diagnosis Date  . Acne     Surgical History:  Past Surgical History:  Procedure Laterality Date  . GUM SURGERY    . TONSILLECTOMY AND ADENOIDECTOMY      Medications:  Current Outpatient Medications on File Prior to Visit  Medication Sig  . JUNEL FE 1.5/30 1.5-30 MG-MCG tablet Take 1 tablet by mouth daily.  Marland Kitchen MYORISAN 40 MG capsule Take 40 mg by mouth 2 (two) times daily.   No current facility-administered medications on file prior to visit.     Allergies:  Allergies  Allergen Reactions  . Benzoyl Peroxide     Social History:  Social History   Socioeconomic History  . Marital status: Single    Spouse name: Not on file  . Number of children: Not on file  . Years of education: Not on file  . Highest education level: Not on file  Occupational History  . Not on file  Social Needs  . Financial resource strain: Not on file  . Food insecurity:    Worry: Not on file    Inability: Not on file  . Transportation needs:    Medical: Not  on file    Non-medical: Not on file  Tobacco Use  . Smoking status: Never Smoker  . Smokeless tobacco: Never Used  Substance and Sexual Activity  . Alcohol use: No    Alcohol/week: 0.0 standard drinks  . Drug use: No  . Sexual activity: Yes    Birth control/protection: None, Condom  Lifestyle  . Physical activity:    Days per week: Not on file    Minutes per session: Not on file  . Stress: Not on file  Relationships  . Social connections:    Talks on phone: Not on file    Gets together: Not on file    Attends religious service: Not on file    Active member of club or organization: Not on file    Attends meetings of clubs or organizations: Not on file    Relationship status: Not on file  . Intimate partner violence:    Fear of current or ex partner: Not on file    Emotionally abused: Not on file    Physically abused: Not on file    Forced sexual activity: Not on file  Other Topics Concern  . Not on file  Social History Narrative  . Not on file  Social History   Tobacco Use  Smoking Status Never Smoker  Smokeless Tobacco Never Used   Social History   Substance and Sexual Activity  Alcohol Use No  . Alcohol/week: 0.0 standard drinks    Family History:  Family History  Problem Relation Age of Onset  . Hypertension Maternal Grandfather   . Diabetes Paternal Grandfather   . Heart murmur Paternal Grandmother     Past medical history, surgical history, medications, allergies, family history and social history reviewed with patient today and changes made to appropriate areas of the chart.   Review of Systems  Constitutional: Negative.   HENT: Negative.   Eyes: Negative.   Respiratory: Negative.   Cardiovascular: Negative.   Gastrointestinal: Negative.   Genitourinary: Negative.   Musculoskeletal: Negative.   Skin: Negative.   Neurological: Negative.   Endo/Heme/Allergies: Positive for environmental allergies. Negative for polydipsia. Does not bruise/bleed  easily.  Psychiatric/Behavioral: Negative.     All other ROS negative except what is listed above and in the HPI.      Objective:    BP 110/74   Pulse 80   Temp 99.5 F (37.5 C) (Oral)   Ht 5' 7.4" (1.712 m)   Wt 119 lb 12.8 oz (54.3 kg)   LMP 07/18/2018   SpO2 99%   BMI 18.54 kg/m   Wt Readings from Last 3 Encounters:  08/14/18 119 lb 12.8 oz (54.3 kg) (33 %, Z= -0.43)*  12/05/17 118 lb (53.5 kg) (32 %, Z= -0.48)*  08/01/16 118 lb (53.5 kg) (38 %, Z= -0.31)*   * Growth percentiles are based on CDC (Girls, 2-20 Years) data.    Physical Exam Vitals signs and nursing note reviewed.  Constitutional:      General: She is not in acute distress.    Appearance: Normal appearance. She is not ill-appearing, toxic-appearing or diaphoretic.  HENT:     Head: Normocephalic and atraumatic.     Right Ear: Tympanic membrane, ear canal and external ear normal. There is no impacted cerumen.     Left Ear: Tympanic membrane, ear canal and external ear normal. There is no impacted cerumen.     Nose: Nose normal. No congestion or rhinorrhea.     Mouth/Throat:     Mouth: Mucous membranes are moist.     Pharynx: Oropharynx is clear. No oropharyngeal exudate or posterior oropharyngeal erythema.  Eyes:     General: No scleral icterus.       Right eye: No discharge.        Left eye: No discharge.     Extraocular Movements: Extraocular movements intact.     Conjunctiva/sclera: Conjunctivae normal.     Pupils: Pupils are equal, round, and reactive to light.  Neck:     Musculoskeletal: Normal range of motion and neck supple. No neck rigidity or muscular tenderness.     Vascular: No carotid bruit.  Cardiovascular:     Rate and Rhythm: Normal rate and regular rhythm.     Pulses: Normal pulses.     Heart sounds: No murmur. No friction rub. No gallop.   Pulmonary:     Effort: Pulmonary effort is normal. No respiratory distress.     Breath sounds: Normal breath sounds. No stridor. No wheezing,  rhonchi or rales.  Chest:     Chest wall: No tenderness.  Abdominal:     General: Abdomen is flat. Bowel sounds are normal. There is no distension.     Palpations: Abdomen is soft. There is no  mass.     Tenderness: There is no abdominal tenderness. There is no right CVA tenderness, left CVA tenderness, guarding or rebound.     Hernia: No hernia is present.  Genitourinary:    Comments: Breast and pelvic exams deferred with shared decision making Musculoskeletal:        General: No swelling, tenderness, deformity or signs of injury.     Right lower leg: No edema.     Left lower leg: No edema.  Lymphadenopathy:     Cervical: No cervical adenopathy.  Skin:    General: Skin is warm and dry.     Capillary Refill: Capillary refill takes less than 2 seconds.     Coloration: Skin is not jaundiced or pale.     Findings: No bruising, erythema, lesion or rash.  Neurological:     General: No focal deficit present.     Mental Status: She is alert and oriented to person, place, and time. Mental status is at baseline.     Cranial Nerves: No cranial nerve deficit.     Sensory: No sensory deficit.     Motor: No weakness.     Coordination: Coordination normal.     Gait: Gait normal.     Deep Tendon Reflexes: Reflexes normal.  Psychiatric:        Mood and Affect: Mood normal.        Behavior: Behavior normal.        Thought Content: Thought content normal.        Judgment: Judgment normal.     Results for orders placed or performed in visit on 12/05/17  Cervicovaginal ancillary only  Result Value Ref Range   Chlamydia Negative    Neisseria gonorrhea Negative    Trichomonas Negative       Assessment & Plan:   Problem List Items Addressed This Visit    None    Visit Diagnoses    Routine general medical examination at a health care facility    -  Primary   Vaccines updated. Continue diet and exercise. Follows with GYN. Call with any concerns.    Immunization due       Vaccines  updated. To return for 2nd HPV in 1 month and 2nd Hep A and 3rd HPV in 6 months.   Relevant Orders   HPV 9-valent vaccine,Recombinat   HPV 9-valent vaccine,Recombinat   Hepatitis A vaccine adult IM       Follow up plan: Return in about 1 year (around 08/14/2019) for Physical.   LABORATORY TESTING:  - Pap smear: not applicable  IMMUNIZATIONS:   - Tdap: Tetanus vaccination status reviewed: Tdap vaccination indicated and given today. - Influenza: Postponed to flu season - Varicella: Given today - Meningitis: Given today - Hep A: Given today - HPV: Given today  PATIENT COUNSELING:   Advised to take 1 mg of folate supplement per day if capable of pregnancy.   Sexuality: Discussed sexually transmitted diseases, partner selection, use of condoms, avoidance of unintended pregnancy  and contraceptive alternatives.   Advised to avoid cigarette smoking.  I discussed with the patient that most people either abstain from alcohol or drink within safe limits (<=14/week and <=4 drinks/occasion for males, <=7/weeks and <= 3 drinks/occasion for females) and that the risk for alcohol disorders and other health effects rises proportionally with the number of drinks per week and how often a drinker exceeds daily limits.  Discussed cessation/primary prevention of drug use and availability of treatment for abuse.  Diet: Encouraged to adjust caloric intake to maintain  or achieve ideal body weight, to reduce intake of dietary saturated fat and total fat, to limit sodium intake by avoiding high sodium foods and not adding table salt, and to maintain adequate dietary potassium and calcium preferably from fresh fruits, vegetables, and low-fat dairy products.    stressed the importance of regular exercise  Injury prevention: Discussed safety belts, safety helmets, smoke detector, smoking near bedding or upholstery.   Dental health: Discussed importance of regular tooth brushing, flossing, and dental  visits.    NEXT PREVENTATIVE PHYSICAL DUE IN 1 YEAR. Return in about 1 year (around 08/14/2019) for Physical.

## 2018-10-09 ENCOUNTER — Ambulatory Visit (INDEPENDENT_AMBULATORY_CARE_PROVIDER_SITE_OTHER): Payer: BC Managed Care – PPO

## 2018-10-09 ENCOUNTER — Other Ambulatory Visit: Payer: Self-pay

## 2018-10-09 DIAGNOSIS — Z23 Encounter for immunization: Secondary | ICD-10-CM

## 2018-11-23 ENCOUNTER — Encounter: Payer: Self-pay | Admitting: Family Medicine

## 2018-11-23 ENCOUNTER — Ambulatory Visit: Payer: BC Managed Care – PPO | Admitting: Family Medicine

## 2018-11-23 ENCOUNTER — Other Ambulatory Visit: Payer: Self-pay

## 2018-11-23 VITALS — BP 106/70 | HR 68 | Temp 98.1°F

## 2018-11-23 DIAGNOSIS — H01131 Eczematous dermatitis of right upper eyelid: Secondary | ICD-10-CM | POA: Diagnosis not present

## 2018-11-23 DIAGNOSIS — H01134 Eczematous dermatitis of left upper eyelid: Secondary | ICD-10-CM

## 2018-11-23 MED ORDER — EUCRISA 2 % EX OINT: 1.0000 "application " | TOPICAL_OINTMENT | Freq: Two times a day (BID) | CUTANEOUS | 1 refills | Status: DC

## 2018-11-23 NOTE — Progress Notes (Signed)
BP 106/70   Pulse 68   Temp 98.1 F (36.7 C) (Oral)   SpO2 99%    Subjective:    Patient ID: Marie Park, female    DOB: 2000-12-20, 18 y.o.   MRN: [id number]  HPI: Marie Park is a 18 y.o. female  Chief Complaint  Patient presents with  . Belepharitis    pt states her eyelids have been swelling since April, states it is off and on, left usually worse than the right   Has been having some swelling of her L eye lid for about 3 months. Skin has been dry and flakey a bit afterwards. Usually in the AM it swells, gets red and pink, no heat. No changes in face wash or cosmetics or detergent. She has had similar issues on her neck that the dermatologist gave her hydrocortisone for. She is otherwise feeling well with no other concerns or complaints at this time.   Relevant past medical, surgical, family and social history reviewed and updated as indicated. Interim medical history since our last visit reviewed. Allergies and medications reviewed and updated.  Review of Systems  Constitutional: Negative.   Eyes: Positive for itching. Negative for photophobia, pain, discharge, redness and visual disturbance.  Respiratory: Negative.   Cardiovascular: Negative.   Psychiatric/Behavioral: Negative.     Per HPI unless specifically indicated above     Objective:    BP 106/70   Pulse 68   Temp 98.1 F (36.7 C) (Oral)   SpO2 99%   Wt Readings from Last 3 Encounters:  08/14/18 119 lb 12.8 oz (54.3 kg) (33 %, Z= -0.43)*  12/05/17 118 lb (53.5 kg) (32 %, Z= -0.48)*  08/01/16 118 lb (53.5 kg) (38 %, Z= -0.31)*   * Growth percentiles are based on CDC (Girls, 2-20 Years) data.    Physical Exam Vitals signs and nursing note reviewed.  Constitutional:      General: She is not in acute distress.    Appearance: Normal appearance. She is not ill-appearing, toxic-appearing or diaphoretic.  HENT:     Head: Normocephalic and atraumatic.     Right Ear: External ear normal.   Left Ear: External ear normal.     Nose: Nose normal.     Mouth/Throat:     Mouth: Mucous membranes are moist.     Pharynx: Oropharynx is clear.  Eyes:     General: No scleral icterus.       Right eye: No discharge.        Left eye: No discharge.     Extraocular Movements: Extraocular movements intact.     Conjunctiva/sclera: Conjunctivae normal.     Pupils: Pupils are equal, round, and reactive to light.     Comments: Dry, irritated skin on bilateral upper eye lids  Neck:     Musculoskeletal: Normal range of motion and neck supple.  Cardiovascular:     Rate and Rhythm: Normal rate and regular rhythm.     Pulses: Normal pulses.     Heart sounds: Normal heart sounds. No murmur. No friction rub. No gallop.   Pulmonary:     Effort: Pulmonary effort is normal. No respiratory distress.     Breath sounds: Normal breath sounds. No stridor. No wheezing, rhonchi or rales.  Chest:     Chest wall: No tenderness.  Musculoskeletal: Normal range of motion.  Skin:    General: Skin is warm and dry.     Capillary Refill: Capillary refill takes less than 2  seconds.     Coloration: Skin is not jaundiced or pale.     Findings: No bruising, erythema, lesion or rash.  Neurological:     General: No focal deficit present.     Mental Status: She is alert and oriented to person, place, and time. Mental status is at baseline.  Psychiatric:        Mood and Affect: Mood normal.        Behavior: Behavior normal.        Thought Content: Thought content normal.        Judgment: Judgment normal.     Results for orders placed or performed in visit on 12/05/17  Cervicovaginal ancillary only  Result Value Ref Range   Chlamydia Negative    Neisseria gonorrhea Negative    Trichomonas Negative       Assessment & Plan:   Problem List Items Addressed This Visit    None    Visit Diagnoses    Eczematous dermatitis of upper eyelids of both eyes    -  Primary   Will treat with eucrisa. Call with any  concerns. Continue to monitor.        Follow up plan: Return if symptoms worsen or fail to improve.

## 2018-11-26 ENCOUNTER — Telehealth: Payer: Self-pay

## 2018-11-26 NOTE — Telephone Encounter (Signed)
Prior authorization for Eucrisa 2%  was initiated via [email or web address]. Key: [id number]    If denied:  These first-line therapies may not require a prior authorization.*  Triamcinolone Acetonide Mometasone Furoate Betamethasone Valerate Betamethasone Dipropionate Fluocinolone Acetonide Tacrolimus

## 2018-11-29 ENCOUNTER — Telehealth: Payer: Self-pay | Admitting: Family Medicine

## 2018-11-29 NOTE — Telephone Encounter (Signed)
Left message on machine for pt to return call to the office.  

## 2018-11-29 NOTE — Telephone Encounter (Signed)
Did she use the card I gave her?

## 2018-11-29 NOTE — Telephone Encounter (Signed)
Copied from [place] [id number]. Topic: Quick Communication - See Telephone Encounter >> Nov 29, 2018 12:11 PM Loma [place] wrote: CRM for notification. See Telephone encounter for: 11/29/18. Pt is stating that a P/A needs to be sent to [place] for [employer] (EUCRISA) 2 % OINT as of now it will be over 700.00 so she could not purchase. [facility] [place], [place] [phone] (Phone) [phone] (Fax)

## 2018-11-30 NOTE — Telephone Encounter (Signed)
Called and spoke to patient. She states that she read on the card that she had to do something on the website in order to use the card she was given.   Will go ahead and submit the PA in the meantime.

## 2018-11-30 NOTE — Telephone Encounter (Signed)
PA for Eucrisa submitted on Cover My Meds. Key: [id number]

## 2018-12-05 ENCOUNTER — Other Ambulatory Visit: Payer: Self-pay

## 2018-12-05 ENCOUNTER — Encounter: Payer: Self-pay | Admitting: Family Medicine

## 2018-12-05 ENCOUNTER — Ambulatory Visit: Payer: BC Managed Care – PPO | Admitting: Family Medicine

## 2018-12-05 VITALS — BP 100/66 | HR 84 | Temp 98.2°F | Ht 68.0 in | Wt 122.0 lb

## 2018-12-05 DIAGNOSIS — B359 Dermatophytosis, unspecified: Secondary | ICD-10-CM

## 2018-12-05 MED ORDER — KETOCONAZOLE 2 % EX CREA: 1.0000 "application " | TOPICAL_CREAM | Freq: Every day | CUTANEOUS | 0 refills | Status: DC

## 2018-12-05 NOTE — Progress Notes (Signed)
BP 100/66 (BP Location: Left Arm, Patient Position: Sitting, Cuff Size: Normal)   Pulse 84   Temp 98.2 F (36.8 C) (Oral)   Ht 5\' 8"  (1.727 m)   Wt 122 lb (55.3 kg)   SpO2 98%   BMI 18.55 kg/m    Subjective:    Patient ID: Marie Park, female    DOB: [birth year], 18 y.o.   MRN: [id number]  HPI: Marie Park is a 18 y.o. female  Chief Complaint  Patient presents with  . Rash    Bilateral under arms. Ongoing 5 days. The rash is itches.    Patient presenting today for an itchy rash the past almost week now that is mainly in her underarm area. Has not been trying anything OTC for sxs. No new products, known exposures, allergies to anything.   Relevant past medical, surgical, family and social history reviewed and updated as indicated. Interim medical history since our last visit reviewed. Allergies and medications reviewed and updated.  Review of Systems  Per HPI unless specifically indicated above     Objective:    BP 100/66 (BP Location: Left Arm, Patient Position: Sitting, Cuff Size: Normal)   Pulse 84   Temp 98.2 F (36.8 C) (Oral)   Ht 5\' 8"  (1.727 m)   Wt 122 lb (55.3 kg)   SpO2 98%   BMI 18.55 kg/m   Wt Readings from Last 3 Encounters:  12/05/18 122 lb (55.3 kg)  08/14/18 119 lb 12.8 oz (54.3 kg) (33 %, Z= -0.43)*  12/05/17 118 lb (53.5 kg) (32 %, Z= -0.48)*   * Growth percentiles are based on CDC (Girls, 2-20 Years) data.    Physical Exam Vitals signs and nursing note reviewed.  Constitutional:      Appearance: Normal appearance. She is not ill-appearing.  HENT:     Head: Atraumatic.  Eyes:     Extraocular Movements: Extraocular movements intact.     Conjunctiva/sclera: Conjunctivae normal.  Neck:     Musculoskeletal: Normal range of motion and neck supple.  Cardiovascular:     Rate and Rhythm: Normal rate and regular rhythm.     Heart sounds: Normal heart sounds.  Pulmonary:     Effort: Pulmonary effort is normal.     Breath sounds:  Normal breath sounds.  Musculoskeletal: Normal range of motion.     Comments: Annular rashes with raised, scaly borders b/l axillary areas  Skin:    General: Skin is warm and dry.  Neurological:     Mental Status: She is alert and oriented to person, place, and time.  Psychiatric:        Mood and Affect: Mood normal.        Thought Content: Thought content normal.        Judgment: Judgment normal.     Results for orders placed or performed in visit on 12/05/17  Cervicovaginal ancillary only  Result Value Ref Range   Chlamydia Negative    Neisseria gonorrhea Negative    Trichomonas Negative       Assessment & Plan:   Problem List Items Addressed This Visit    None    Visit Diagnoses    Ringworm    -  Primary   Tx with ketoconazole, can try tea tree oil as well. F/u if not improving   Relevant Medications   ketoconazole (NIZORAL) 2 % cream       Follow up plan: Return if symptoms worsen or fail to improve.

## 2018-12-10 ENCOUNTER — Telehealth: Payer: Self-pay

## 2018-12-10 DIAGNOSIS — Z30011 Encounter for initial prescription of contraceptive pills: Secondary | ICD-10-CM

## 2018-12-10 DIAGNOSIS — N946 Dysmenorrhea, unspecified: Secondary | ICD-10-CM

## 2018-12-10 MED ORDER — JUNEL FE 1.5/30 1.5-30 MG-MCG PO TABS: 1.0000 | ORAL_TABLET | Freq: Every day | ORAL | 0 refills | Status: DC

## 2018-12-10 NOTE — Telephone Encounter (Signed)
Pt has apt 12/18/2018 but will take her last pill today. Requesting refill. SH#[phone]

## 2018-12-10 NOTE — Telephone Encounter (Signed)
Notified 1 rf sent.

## 2018-12-14 NOTE — Progress Notes (Signed)
PCP:  Valerie Roys, DO   Chief Complaint  Patient presents with  . Gynecologic Exam     HPI:      [name] is a 18 y.o. No obstetric history on file. who LMP was Patient's last menstrual period was 12/05/2018 (exact date)., presents today for her annual examination.  Her menses are regular every 28-30 days, lasting 3-4 days.  Dysmenorrhea mild, greatly improved with OCPs.  She does not have intermenstrual bleeding.  Sex activity: not sexually active. Has been in past but since she was seen last yr.  Last Pap: N/A  Hx of STDs: none  There is no FH of breast cancer. There is no FH of ovarian cancer. The patient does do self-breast exams.  Tobacco use: The patient denies current or previous tobacco use. Alcohol use: none No drug use.  Exercise: moderately active  She does get adequate calcium and Vitamin D in her diet. Gardasil not done.   Past Medical History:  Diagnosis Date  . Acne     Past Surgical History:  Procedure Laterality Date  . GUM SURGERY    . TONSILLECTOMY AND ADENOIDECTOMY      Family History  Problem Relation Age of Onset  . Hypertension Maternal Grandfather   . Diabetes Paternal Grandfather   . Heart murmur Paternal Grandmother     Social History   Socioeconomic History  . Marital status: Single    Spouse name: Not on file  . Number of children: Not on file  . Years of education: Not on file  . Highest education level: Not on file  Occupational History  . Not on file  Social Needs  . Financial resource strain: Not on file  . Food insecurity    Worry: Not on file    Inability: Not on file  . Transportation needs    Medical: Not on file    Non-medical: Not on file  Tobacco Use  . Smoking status: Never Smoker  . Smokeless tobacco: Never Used  Substance and Sexual Activity  . Alcohol use: No    Alcohol/week: 0.0 standard drinks  . Drug use: No  . Sexual activity: Not Currently    Birth control/protection: Pill   Lifestyle  . Physical activity    Days per week: Not on file    Minutes per session: Not on file  . Stress: Not on file  Relationships  . Social Herbalist on phone: Not on file    Gets together: Not on file    Attends religious service: Not on file    Active member of club or organization: Not on file    Attends meetings of clubs or organizations: Not on file    Relationship status: Not on file  . Intimate partner violence    Fear of current or ex partner: Not on file    Emotionally abused: Not on file    Physically abused: Not on file    Forced sexual activity: Not on file  Other Topics Concern  . Not on file  Social History Narrative  . Not on file    Outpatient Medications Prior to Visit  Medication Sig Dispense Refill  . ketoconazole (NIZORAL) 2 % cream Apply 1 application topically daily. 30 g 0  . JUNEL FE 1.5/30 1.5-30 MG-MCG tablet Take 1 tablet by mouth daily for 28 days. 1 Package 0  . Crisaborole (EUCRISA) 2 % OINT Apply 1 application topically 2 (two) times a  day. (Patient not taking: Reported on 12/05/2018) 100 g 1  . hydrocortisone (ANUSOL-HC) 2.5 % rectal cream      No facility-administered medications prior to visit.     ROS:  Review of Systems  Constitutional: Negative for fatigue, fever and unexpected weight change.  Respiratory: Negative for cough, shortness of breath and wheezing.   Cardiovascular: Negative for chest pain, palpitations and leg swelling.  Gastrointestinal: Negative for blood in stool, constipation, diarrhea, nausea and vomiting.  Endocrine: Negative for cold intolerance, heat intolerance and polyuria.  Genitourinary: Negative for dyspareunia, dysuria, flank pain, frequency, genital sores, hematuria, menstrual problem, pelvic pain, urgency, vaginal bleeding, vaginal discharge and vaginal pain.  Musculoskeletal: Negative for back pain, joint swelling and myalgias.  Skin: Negative for rash.  Neurological: Negative for dizziness,  syncope, light-headedness, numbness and headaches.  Hematological: Negative for adenopathy.  Psychiatric/Behavioral: Negative for agitation, confusion, sleep disturbance and suicidal ideas. The patient is not nervous/anxious.    BREAST: No symptoms   Objective: BP 110/60   Ht 5\' 8"  (1.727 m)   Wt 120 lb 9.6 oz (54.7 kg)   LMP 12/05/2018 (Exact Date)   BMI 18.34 kg/m    Physical Exam Constitutional:      Appearance: She is well-developed.  Genitourinary:     Vulva, vagina, uterus, right adnexa and left adnexa normal.     No vulval lesion or tenderness noted.     No vaginal discharge, erythema or tenderness.     No cervical motion tenderness or polyp.     Uterus is not enlarged or tender.     No right or left adnexal mass present.     Right adnexa not tender.     Left adnexa not tender.  Neck:     Musculoskeletal: Normal range of motion.     Thyroid: No thyromegaly.  Cardiovascular:     Rate and Rhythm: Normal rate and regular rhythm.     Heart sounds: Normal heart sounds. No murmur.  Pulmonary:     Effort: Pulmonary effort is normal.     Breath sounds: Normal breath sounds.  Chest:     Breasts:        Right: No mass, nipple discharge, skin change or tenderness.        Left: No mass, nipple discharge, skin change or tenderness.  Abdominal:     Palpations: Abdomen is soft.     Tenderness: There is no abdominal tenderness. There is no guarding.  Musculoskeletal: Normal range of motion.  Neurological:     General: No focal deficit present.     Mental Status: She is alert and oriented to person, place, and time.     Cranial Nerves: No cranial nerve deficit.  Skin:    General: Skin is warm and dry.  Psychiatric:        Mood and Affect: Mood normal.        Behavior: Behavior normal.        Thought Content: Thought content normal.        Judgment: Judgment normal.  Vitals signs reviewed.     Assessment/Plan: Encounter for annual routine gynecological examination -    Encounter for surveillance of contraceptive pills - Plan: JUNEL FE 1.5/30 1.5-30 MG-MCG tablet, OCP RF  Dysmenorrhea in adolescent - Plan: JUNEL FE 1.5/30 1.5-30 MG-MCG tablet, Sx improved with OCPs. F/u prn.   Meds ordered this encounter  Medications  . JUNEL FE 1.5/30 1.5-30 MG-MCG tablet    Sig: Take 1 tablet by  mouth daily for 28 days.    Dispense:  3 Package    Refill:  3    Order Specific Question:   Supervising Provider    Answer:   Nadara MustardHARRIS, ROBERT P [id number]]             GYN counsel adequate intake of calcium and vitamin D, diet and exercise, Gardasil discussed and handout given.     F/U  Return in about 1 year (around 12/17/2019).   B. , PA-C 12/17/2018 11:43 AM

## 2018-12-17 ENCOUNTER — Encounter: Payer: Self-pay | Admitting: Obstetrics and Gynecology

## 2018-12-17 ENCOUNTER — Other Ambulatory Visit: Payer: Self-pay

## 2018-12-17 ENCOUNTER — Ambulatory Visit (INDEPENDENT_AMBULATORY_CARE_PROVIDER_SITE_OTHER): Payer: BC Managed Care – PPO | Admitting: Obstetrics and Gynecology

## 2018-12-17 VITALS — BP 110/60 | Ht 68.0 in | Wt 120.6 lb

## 2018-12-17 DIAGNOSIS — N946 Dysmenorrhea, unspecified: Secondary | ICD-10-CM

## 2018-12-17 DIAGNOSIS — Z01419 Encounter for gynecological examination (general) (routine) without abnormal findings: Secondary | ICD-10-CM

## 2018-12-17 DIAGNOSIS — Z3041 Encounter for surveillance of contraceptive pills: Secondary | ICD-10-CM

## 2018-12-17 MED ORDER — JUNEL FE 1.5/30 1.5-30 MG-MCG PO TABS: 1.0000 | ORAL_TABLET | Freq: Every day | ORAL | 3 refills | Status: DC

## 2018-12-17 NOTE — Patient Instructions (Signed)
I value your feedback and entrusting us with your care. If you get a [facility] patient survey, I would appreciate you taking the time to let us know about your experience today. Thank you! 

## 2018-12-17 NOTE — Telephone Encounter (Signed)
[facility] and pharmacist stated that a notification was sent on 11/23/18 and again today that this drug requires prior authorization. Pt called and was wanting the medication.  Called pt and LM.  Routing to provider [facility] pool.

## 2018-12-17 NOTE — Telephone Encounter (Signed)
Pt called to follow up on refill for Crisaborole (EUCRISA) 2 % OINT. The pharmacy still has not received.  [place], [place] - [place] [phone] (Phone) [phone] (Fax)

## 2018-12-18 NOTE — Telephone Encounter (Signed)
PA for Georga Hacking was denied. Routing to provider.  Alternative therapies are: Triamcinolone Acetonide, Mometasone Furoate, Betamethasone Valerate, Betamethasone Dipropionate, Fluocinolone Acetonide, Tacrolimus

## 2018-12-24 NOTE — Telephone Encounter (Signed)
Since her insurance will not pay for her medicine- I would advise her to follow up with her dermatologist if she is not better.

## 2018-12-24 NOTE — Telephone Encounter (Signed)
Called and left a message for patient letting her know to contact dermatology

## 2018-12-25 ENCOUNTER — Other Ambulatory Visit: Payer: Self-pay | Admitting: Family Medicine

## 2018-12-25 NOTE — Telephone Encounter (Signed)
Routing to provider  

## 2018-12-25 NOTE — Telephone Encounter (Signed)
Please advise if Apolonio Schneiders wanted to continue this rx?c

## 2018-12-25 NOTE — Telephone Encounter (Signed)
Called and spoke to patient. Message relayed.  Patient stated that she will call back to schedule an OV

## 2018-12-25 NOTE — Telephone Encounter (Signed)
If she's not better, she should probably be seen since it's been almost a month

## 2019-02-18 ENCOUNTER — Ambulatory Visit: Payer: BC Managed Care – PPO

## 2019-03-04 ENCOUNTER — Ambulatory Visit (INDEPENDENT_AMBULATORY_CARE_PROVIDER_SITE_OTHER): Payer: BC Managed Care – PPO

## 2019-03-04 ENCOUNTER — Other Ambulatory Visit: Payer: Self-pay

## 2019-03-04 DIAGNOSIS — Z23 Encounter for immunization: Secondary | ICD-10-CM | POA: Diagnosis not present

## 2019-07-22 ENCOUNTER — Telehealth: Payer: Self-pay

## 2019-07-22 NOTE — Telephone Encounter (Signed)
Pt hasn't had her cycle this month. She gets her periods pretty much every month. She's not sexually active, thinks its stress. Wants to know if she should stop her BC. Last months cycle was short 2-3 days, when its normally 4 days. Please advise.

## 2019-07-22 NOTE — Telephone Encounter (Signed)
It is common for women on OCPs to skip periods, especially with stress and generic OCPs. Continue pills as normal, no need to stop them. F/u prn.

## 2019-07-23 NOTE — Telephone Encounter (Signed)
Pt aware.

## 2019-10-29 ENCOUNTER — Other Ambulatory Visit: Payer: Self-pay

## 2019-10-29 DIAGNOSIS — Z3041 Encounter for surveillance of contraceptive pills: Secondary | ICD-10-CM

## 2019-10-29 DIAGNOSIS — N946 Dysmenorrhea, unspecified: Secondary | ICD-10-CM

## 2019-10-29 MED ORDER — JUNEL FE 1.5/30 1.5-30 MG-MCG PO TABS: 1.0000 | ORAL_TABLET | Freq: Every day | ORAL | 0 refills | Status: DC

## 2019-10-29 NOTE — Telephone Encounter (Signed)
Pt calling for refill of bcp - will rub out before appt.  [phone]  Pt aware refill eRx'd.

## 2019-12-09 ENCOUNTER — Other Ambulatory Visit: Payer: Self-pay | Admitting: Obstetrics and Gynecology

## 2019-12-09 DIAGNOSIS — Z3041 Encounter for surveillance of contraceptive pills: Secondary | ICD-10-CM

## 2019-12-09 DIAGNOSIS — N946 Dysmenorrhea, unspecified: Secondary | ICD-10-CM

## 2019-12-18 NOTE — Progress Notes (Signed)
PCP:  Dorcas Carrow, DO   Chief Complaint  Patient presents with  . Gynecologic Exam     HPI:      [name] is a 19 y.o. No obstetric history on file. who LMP was Patient's last menstrual period was 12/03/2019., presents today for her annual examination.  Her menses are regular every 28-30 days, lasting 4-5 days.  Dysmenorrhea mild, greatly improved with OCPs.  She does not have intermenstrual bleeding.  Sex activity: not sexually active. Has been in past but not since she was seen last yr.  Last Pap: N/A due to age Hx of STDs: none  There is no FH of breast cancer. There is no FH of ovarian cancer. The patient does do self-breast exams.  Tobacco use: The patient denies current or previous tobacco use. Alcohol use: none No drug use.  Exercise: moderately active  She does get adequate calcium and Vitamin D in her diet. Gardasil not done.   Past Medical History:  Diagnosis Date  . Acne     Past Surgical History:  Procedure Laterality Date  . GUM SURGERY    . TONSILLECTOMY AND ADENOIDECTOMY      Family History  Problem Relation Age of Onset  . Hypertension Maternal Grandfather   . Diabetes Paternal Grandfather   . Heart murmur Paternal Grandmother     Social History   Socioeconomic History  . Marital status: Single    Spouse name: Not on file  . Number of children: Not on file  . Years of education: Not on file  . Highest education level: Not on file  Occupational History  . Not on file  Tobacco Use  . Smoking status: Never Smoker  . Smokeless tobacco: Never Used  Vaping Use  . Vaping Use: Never used  Substance and Sexual Activity  . Alcohol use: No    Alcohol/week: 0.0 standard drinks  . Drug use: No  . Sexual activity: Not Currently    Birth control/protection: Pill  Other Topics Concern  . Not on file  Social History Narrative  . Not on file   Social Determinants of Health   Financial Resource Strain:   . Difficulty of  Paying Living Expenses:   Food Insecurity:   . Worried About Programme researcher, broadcasting/film/video in the Last Year:   . Barista in the Last Year:   Transportation Needs:   . Freight forwarder (Medical):   Marland Kitchen Lack of Transportation (Non-Medical):   Physical Activity:   . Days of Exercise per Week:   . Minutes of Exercise per Session:   Stress:   . Feeling of Stress :   Social Connections:   . Frequency of Communication with Friends and Family:   . Frequency of Social Gatherings with Friends and Family:   . Attends Religious Services:   . Active Member of Clubs or Organizations:   . Attends Banker Meetings:   Marland Kitchen Marital Status:   Intimate Partner Violence:   . Fear of Current or Ex-Partner:   . Emotionally Abused:   Marland Kitchen Physically Abused:   . Sexually Abused:     Outpatient Medications Prior to Visit  Medication Sig Dispense Refill  . LARIN FE 1.5/30 1.5-30 MG-MCG tablet TAKE ONE TABLET BY MOUTH ONCE DAILY 28 tablet 0  . Crisaborole (EUCRISA) 2 % OINT Apply 1 application topically 2 (two) times a day. (Patient not taking: Reported on 12/05/2018) 100 g 1  . hydrocortisone (  ANUSOL-HC) 2.5 % rectal cream  (Patient not taking: Reported on 12/19/2019)    . ketoconazole (NIZORAL) 2 % cream Apply 1 application topically daily. (Patient not taking: Reported on 12/19/2019) 30 g 0   No facility-administered medications prior to visit.    ROS:  Review of Systems  Constitutional: Negative for fatigue, fever and unexpected weight change.  Respiratory: Negative for cough, shortness of breath and wheezing.   Cardiovascular: Negative for chest pain, palpitations and leg swelling.  Gastrointestinal: Negative for blood in stool, constipation, diarrhea, nausea and vomiting.  Endocrine: Negative for cold intolerance, heat intolerance and polyuria.  Genitourinary: Negative for dyspareunia, dysuria, flank pain, frequency, genital sores, hematuria, menstrual problem, pelvic pain, urgency,  vaginal bleeding, vaginal discharge and vaginal pain.  Musculoskeletal: Negative for back pain, joint swelling and myalgias.  Skin: Negative for rash.  Neurological: Negative for dizziness, syncope, light-headedness, numbness and headaches.  Hematological: Negative for adenopathy.  Psychiatric/Behavioral: Negative for agitation, confusion, sleep disturbance and suicidal ideas. The patient is not nervous/anxious.    BREAST: No symptoms   Objective: BP 100/60   Ht 5\' 8"  (1.727 m)   Wt 121 lb (54.9 kg)   LMP 12/03/2019   BMI 18.40 kg/m    Physical Exam Constitutional:      Appearance: She is well-developed.  Genitourinary:     Vulva, vagina, uterus, right adnexa and left adnexa normal.     No vulval lesion or tenderness noted.     No vaginal discharge, erythema or tenderness.     No cervical motion tenderness or polyp.     Uterus is not enlarged or tender.     No right or left adnexal mass present.     Right adnexa not tender.     Left adnexa not tender.  Neck:     Thyroid: No thyromegaly.  Cardiovascular:     Rate and Rhythm: Normal rate and regular rhythm.     Heart sounds: Normal heart sounds. No murmur heard.   Pulmonary:     Effort: Pulmonary effort is normal.     Breath sounds: Normal breath sounds.  Chest:     Breasts:        Right: No mass, nipple discharge, skin change or tenderness.        Left: No mass, nipple discharge, skin change or tenderness.  Abdominal:     Palpations: Abdomen is soft.     Tenderness: There is no abdominal tenderness. There is no guarding.  Musculoskeletal:        General: Normal range of motion.     Cervical back: Normal range of motion.  Neurological:     General: No focal deficit present.     Mental Status: She is alert and oriented to person, place, and time.     Cranial Nerves: No cranial nerve deficit.  Skin:    General: Skin is warm and dry.  Psychiatric:        Mood and Affect: Mood normal.        Behavior: Behavior  normal.        Thought Content: Thought content normal.        Judgment: Judgment normal.  Vitals reviewed.     Assessment/Plan: Encounter for annual routine gynecological examination -   Encounter for surveillance of contraceptive pills - Plan: JUNEL FE 1.5/30 1.5-30 MG-MCG tablet, OCP RF  Dysmenorrhea in adolescent - Plan: JUNEL FE 1.5/30 1.5-30 MG-MCG tablet, Sx improved with OCPs. F/u prn.   Meds ordered this  encounter  Medications  . norethindrone-ethinyl estradiol-iron (LARIN FE 1.5/30) 1.5-30 MG-MCG tablet    Sig: Take 1 tablet by mouth daily.    Dispense:  84 tablet    Refill:  3    Order Specific Question:   Supervising Provider    Answer:   Nadara Mustard [[id number]]             GYN counsel adequate intake of calcium and vitamin D, diet and exercise, Gardasil discussed and handout given.     F/U  Return in about 1 year (around 12/18/2020).  Tyjay Galindo B. Kanylah Muench, PA-C 12/19/2019 11:18 AM

## 2019-12-19 ENCOUNTER — Encounter: Payer: Self-pay | Admitting: Obstetrics and Gynecology

## 2019-12-19 ENCOUNTER — Ambulatory Visit (INDEPENDENT_AMBULATORY_CARE_PROVIDER_SITE_OTHER): Payer: BC Managed Care – PPO | Admitting: Obstetrics and Gynecology

## 2019-12-19 ENCOUNTER — Other Ambulatory Visit (HOSPITAL_COMMUNITY)
Admission: RE | Admit: 2019-12-19 | Discharge: 2019-12-19 | Disposition: A | Discharge status: Home / Self Care | Payer: BC Managed Care – PPO | Source: Ambulatory Visit | Attending: Obstetrics and Gynecology | Admitting: Obstetrics and Gynecology

## 2019-12-19 ENCOUNTER — Other Ambulatory Visit: Payer: Self-pay

## 2019-12-19 VITALS — BP 100/60 | Ht 68.0 in | Wt 121.0 lb

## 2019-12-19 DIAGNOSIS — Z124 Encounter for screening for malignant neoplasm of cervix: Secondary | ICD-10-CM | POA: Insufficient documentation

## 2019-12-19 DIAGNOSIS — Z3041 Encounter for surveillance of contraceptive pills: Secondary | ICD-10-CM

## 2019-12-19 DIAGNOSIS — Z113 Encounter for screening for infections with a predominantly sexual mode of transmission: Secondary | ICD-10-CM | POA: Diagnosis not present

## 2019-12-19 DIAGNOSIS — N946 Dysmenorrhea, unspecified: Secondary | ICD-10-CM

## 2019-12-19 DIAGNOSIS — Z01419 Encounter for gynecological examination (general) (routine) without abnormal findings: Secondary | ICD-10-CM | POA: Diagnosis not present

## 2019-12-19 MED ORDER — NORETHIN ACE-ETH ESTRAD-FE 1.5-30 MG-MCG PO TABS: 1.0000 | ORAL_TABLET | Freq: Every day | ORAL | 3 refills | Status: DC

## 2019-12-19 NOTE — Patient Instructions (Signed)
I value your feedback and entrusting us with your care. If you get a [facility] patient survey, I would appreciate you taking the time to let us know about your experience today. Thank you!  As of April 18, 2019, your lab results will be released to your MyChart immediately, before I even have a chance to see them. Please give me time to review them and contact you if there are any abnormalities. Thank you for your patience.  

## 2019-12-23 LAB — CYTOLOGY - PAP: Diagnosis: NEGATIVE

## 2020-01-05 ENCOUNTER — Other Ambulatory Visit: Payer: Self-pay | Admitting: Obstetrics and Gynecology

## 2020-01-05 DIAGNOSIS — Z3041 Encounter for surveillance of contraceptive pills: Secondary | ICD-10-CM

## 2020-01-05 DIAGNOSIS — N946 Dysmenorrhea, unspecified: Secondary | ICD-10-CM

## 2020-01-11 DIAGNOSIS — Y999 Unspecified external cause status: Secondary | ICD-10-CM | POA: Insufficient documentation

## 2020-01-11 DIAGNOSIS — Y939 Activity, unspecified: Secondary | ICD-10-CM | POA: Diagnosis not present

## 2020-01-11 DIAGNOSIS — S62352A Nondisplaced fracture of shaft of third metacarpal bone, right hand, initial encounter for closed fracture: Secondary | ICD-10-CM | POA: Insufficient documentation

## 2020-01-11 DIAGNOSIS — Y92410 Unspecified street and highway as the place of occurrence of the external cause: Secondary | ICD-10-CM | POA: Insufficient documentation

## 2020-01-11 DIAGNOSIS — S6721XA Crushing injury of right hand, initial encounter: Secondary | ICD-10-CM | POA: Diagnosis present

## 2020-01-12 ENCOUNTER — Other Ambulatory Visit: Payer: Self-pay

## 2020-01-12 ENCOUNTER — Emergency Department: Payer: BC Managed Care – PPO

## 2020-01-12 ENCOUNTER — Telehealth: Payer: Self-pay | Admitting: Emergency Medicine

## 2020-01-12 ENCOUNTER — Emergency Department
Admission: EM | Admit: 2020-01-12 | Discharge: 2020-01-12 | Disposition: A | Discharge status: Home / Self Care | Payer: BC Managed Care – PPO | Attending: Emergency Medicine | Admitting: Emergency Medicine

## 2020-01-12 DIAGNOSIS — S62352A Nondisplaced fracture of shaft of third metacarpal bone, right hand, initial encounter for closed fracture: Secondary | ICD-10-CM

## 2020-01-12 MED ORDER — CEPHALEXIN 500 MG PO CAPS: 500.0000 mg | ORAL_CAPSULE | Freq: Three times a day (TID) | ORAL | 0 refills | Status: AC
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
## 2020-01-12 NOTE — Telephone Encounter (Cosign Needed)
Patient needed prescription for Keflex 500 mg 3 times daily called into her pharmacy.  Prescription sent at this time.

## 2020-01-12 NOTE — ED Notes (Signed)
No answer in lobby no answer on phone
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
## 2020-08-13 ENCOUNTER — Telehealth: Payer: Self-pay

## 2020-08-13 DIAGNOSIS — N946 Dysmenorrhea, unspecified: Secondary | ICD-10-CM

## 2020-08-13 DIAGNOSIS — Z3041 Encounter for surveillance of contraceptive pills: Secondary | ICD-10-CM

## 2020-08-13 MED ORDER — NORETHIN ACE-ETH ESTRAD-FE 1.5-30 MG-MCG PO TABS: 1.0000 | ORAL_TABLET | Freq: Every day | ORAL | 3 refills | Status: DC

## 2020-11-20 ENCOUNTER — Telehealth: Payer: Self-pay

## 2020-11-20 NOTE — Telephone Encounter (Signed)
Spoke w/[facility] to verify they have rx of file that was sent 08/13/20. It is on file. NOTE: Full year was sent on 08/13/20.

## 2020-11-20 NOTE — Telephone Encounter (Signed)
Patient will run out of OCP prior to apt in August. She will run out on 12/07/20. Requesting refill. FM#[phone]

## 2020-11-20 NOTE — Telephone Encounter (Signed)
Spoke w/patient. Advised of active rx on file at [place] that was sent 08/13/20. Please contact pharmacy when refill is needed. Ask for active rx on file to be filled. Will not fill off of current rx # which has expired. Also advised does not have a future appointment for AE scheduled. She is due on/after 8/12. Patient thought she was already scheduled. Transferred to Marie Park for scheduling.

## 2020-11-20 NOTE — Telephone Encounter (Signed)
Spoke with patient. Appointment scheduled for 12/30/20 with [facility]

## 2020-12-29 ENCOUNTER — Telehealth: Payer: Self-pay

## 2020-12-29 NOTE — Telephone Encounter (Signed)
Pt calling; has appt tomorrow for annual; will be on 2nd sugar pill tomorrow and may be on period tomorrow; should she resched or keep appt.  [phone]  Adv pt to keep appt as it is easier to schedule for a pap only than for an annual; pt isn't due for a pap so definitely keep appt.

## 2020-12-30 ENCOUNTER — Other Ambulatory Visit: Payer: Self-pay

## 2020-12-30 ENCOUNTER — Other Ambulatory Visit (HOSPITAL_COMMUNITY)
Admission: RE | Admit: 2020-12-30 | Discharge: 2020-12-30 | Disposition: A | Discharge status: Home / Self Care | Payer: BC Managed Care – PPO | Source: Ambulatory Visit | Attending: Obstetrics and Gynecology | Admitting: Obstetrics and Gynecology

## 2020-12-30 ENCOUNTER — Ambulatory Visit (INDEPENDENT_AMBULATORY_CARE_PROVIDER_SITE_OTHER): Payer: BC Managed Care – PPO | Admitting: Obstetrics and Gynecology

## 2020-12-30 ENCOUNTER — Encounter: Payer: Self-pay | Admitting: Obstetrics and Gynecology

## 2020-12-30 VITALS — BP 100/70 | Ht 68.0 in | Wt 120.0 lb

## 2020-12-30 DIAGNOSIS — K5901 Slow transit constipation: Secondary | ICD-10-CM

## 2020-12-30 DIAGNOSIS — K644 Residual hemorrhoidal skin tags: Secondary | ICD-10-CM

## 2020-12-30 DIAGNOSIS — Z01419 Encounter for gynecological examination (general) (routine) without abnormal findings: Secondary | ICD-10-CM

## 2020-12-30 DIAGNOSIS — N946 Dysmenorrhea, unspecified: Secondary | ICD-10-CM | POA: Diagnosis not present

## 2020-12-30 DIAGNOSIS — Z113 Encounter for screening for infections with a predominantly sexual mode of transmission: Secondary | ICD-10-CM | POA: Insufficient documentation

## 2020-12-30 DIAGNOSIS — Z3041 Encounter for surveillance of contraceptive pills: Secondary | ICD-10-CM

## 2020-12-30 MED ORDER — NORETHIN ACE-ETH ESTRAD-FE 1.5-30 MG-MCG PO TABS: 1.0000 | ORAL_TABLET | Freq: Every day | ORAL | 3 refills | Status: DC

## 2020-12-30 NOTE — Progress Notes (Signed)
PCP:  Pcp, No   Chief Complaint  Patient presents with   Gynecologic Exam    No concerns     HPI:      [name] is a 20 y.o. No obstetric history on file. who LMP was Patient's last menstrual period was 12/02/2020 (exact date)., presents today for her annual examination.  Her menses are regular every 28-30 days, lasting 3-4 days.  Dysmenorrhea mild, greatly improved with OCPs.  She does not have intermenstrual bleeding.  Sex activity: currently sexually active--Contraception OCPs. Last Pap: 12/19/19  Results were normal Hx of STDs: none  There is no FH of breast cancer. There is no FH of ovarian cancer. The patient does do self-breast exams.  Tobacco use: The patient denies current or previous tobacco use. Alcohol use: none No drug use.  Exercise: moderately active  She does get adequate calcium but not Vitamin D in her diet. Thinks Gardasil done. Having issues with external hemorrhoid. Notices bleeding after hard BM rarely; sometimes painful with BM. Has increased water and eating healthier with no sx change.   Past Medical History:  Diagnosis Date   Acne     Past Surgical History:  Procedure Laterality Date   GUM SURGERY     TONSILLECTOMY AND ADENOIDECTOMY      Family History  Problem Relation Age of Onset   Hypertension Maternal Grandfather    Diabetes Paternal Grandfather    Heart murmur Paternal Grandmother     Social History   Socioeconomic History   Marital status: Single    Spouse name: Not on file   Number of children: Not on file   Years of education: Not on file   Highest education level: Not on file  Occupational History   Not on file  Tobacco Use   Smoking status: Never   Smokeless tobacco: Never  Vaping Use   Vaping Use: Never used  Substance and Sexual Activity   Alcohol use: No    Alcohol/week: 0.0 standard drinks   Drug use: No   Sexual activity: Yes    Birth control/protection: Pill  Other Topics Concern   Not on  file  Social History Narrative   Not on file   Social Determinants of Health   Financial Resource Strain: Not on file  Food Insecurity: Not on file  Transportation Needs: Not on file  Physical Activity: Not on file  Stress: Not on file  Social Connections: Not on file  Intimate Partner Violence: Not on file    Outpatient Medications Prior to Visit  Medication Sig Dispense Refill   fluticasone (FLONASE) 50 MCG/ACT nasal spray Place into both nostrils.     norethindrone-ethinyl estradiol-iron (LARIN FE 1.5/30) 1.5-30 MG-MCG tablet Take 1 tablet by mouth daily. 84 tablet 3   No facility-administered medications prior to visit.    ROS:  Review of Systems  Constitutional:  Negative for fatigue, fever and unexpected weight change.  Respiratory:  Negative for cough, shortness of breath and wheezing.   Cardiovascular:  Negative for chest pain, palpitations and leg swelling.  Gastrointestinal:  Positive for constipation and rectal pain. Negative for blood in stool, diarrhea, nausea and vomiting.  Endocrine: Negative for cold intolerance, heat intolerance and polyuria.  Genitourinary:  Negative for dyspareunia, dysuria, flank pain, frequency, genital sores, hematuria, menstrual problem, pelvic pain, urgency, vaginal bleeding, vaginal discharge and vaginal pain.  Musculoskeletal:  Negative for back pain, joint swelling and myalgias.  Skin:  Negative for rash.  Neurological:  Negative for dizziness, syncope, light-headedness, numbness and headaches.  Hematological:  Negative for adenopathy.  Psychiatric/Behavioral:  Negative for agitation, confusion, sleep disturbance and suicidal ideas. The patient is not nervous/anxious.   BREAST: No symptoms   Objective: BP 100/70   Ht 5\' 8"  (1.727 m)   Wt 120 lb (54.4 kg)   LMP 12/02/2020 (Exact Date)   BMI 18.25 kg/m    Physical Exam Constitutional:      Appearance: She is well-developed.  Genitourinary:     Vulva normal.     Right  Labia: No rash, tenderness or lesions.    Left Labia: No tenderness, lesions or rash.    No vaginal discharge, erythema or tenderness.      Right Adnexa: not tender and no mass present.    Left Adnexa: not tender and no mass present.    No cervical motion tenderness, friability or polyp.     Uterus is not enlarged or tender.  Rectum:     External hemorrhoid present.  Breasts:    Right: No mass, nipple discharge, skin change or tenderness.     Left: No mass, nipple discharge, skin change or tenderness.  Neck:     Thyroid: No thyromegaly.  Cardiovascular:     Rate and Rhythm: Normal rate and regular rhythm.     Heart sounds: Normal heart sounds. No murmur heard. Pulmonary:     Effort: Pulmonary effort is normal.     Breath sounds: Normal breath sounds.  Abdominal:     Palpations: Abdomen is soft.     Tenderness: There is no abdominal tenderness. There is no guarding or rebound.  Musculoskeletal:        General: Normal range of motion.     Cervical back: Normal range of motion.  Lymphadenopathy:     Cervical: No cervical adenopathy.  Neurological:     General: No focal deficit present.     Mental Status: She is alert and oriented to person, place, and time.     Cranial Nerves: No cranial nerve deficit.  Skin:    General: Skin is warm and dry.  Psychiatric:        Mood and Affect: Mood normal.        Behavior: Behavior normal.        Thought Content: Thought content normal.        Judgment: Judgment normal.  Vitals reviewed.    Assessment/Plan: Encounter for annual routine gynecological examination  Screening for STD (sexually transmitted disease) - Plan: Cervicovaginal ancillary only  Encounter for surveillance of contraceptive pills - Plan: norethindrone-ethinyl estradiol-iron (LARIN FE 1.5/30) 1.5-30 MG-MCG tablet; OCP RF  Dysmenorrhea in adolescent - Plan: norethindrone-ethinyl estradiol-iron (LARIN FE 1.5/30) 1.5-30 MG-MCG tablet  External hemorrhoid--small  hemorrhoid tag on exam; increase fiber/water, can add colace prn. F/u prn.   Slow transit constipation   Meds ordered this encounter  Medications   norethindrone-ethinyl estradiol-iron (LARIN FE 1.5/30) 1.5-30 MG-MCG tablet    Sig: Take 1 tablet by mouth daily.    Dispense:  84 tablet    Refill:  3    Order Specific Question:   Supervising Provider    Answer:   03-12-1980 Nadara Mustard              GYN counsel adequate intake of calcium and vitamin D, diet and exercise    F/U  Return in about 1 year (around 12/30/2021).  Marie Park B. Apolinar Bero, PA-C 12/30/2020 4:39 PM

## 2020-12-30 NOTE — Patient Instructions (Signed)
I value your feedback and you entrusting us with your care. If you get a [facility] patient survey, I would appreciate you taking the time to let us know about your experience today. Thank you! ? ? ?

## 2021-01-01 LAB — CERVICOVAGINAL ANCILLARY ONLY
Chlamydia: NEGATIVE
Comment: NEGATIVE
Comment: NORMAL
Neisseria Gonorrhea: NEGATIVE
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
## 2021-11-08 ENCOUNTER — Telehealth: Payer: BC Managed Care – PPO | Admitting: Physician Assistant

## 2021-11-08 DIAGNOSIS — B3731 Acute candidiasis of vulva and vagina: Secondary | ICD-10-CM | POA: Diagnosis not present

## 2021-11-08 MED ORDER — FLUCONAZOLE 150 MG PO TABS: 150.0000 mg | ORAL_TABLET | ORAL | 0 refills | Status: DC | PRN

## 2021-11-08 NOTE — Progress Notes (Signed)

## 2022-01-03 NOTE — Progress Notes (Unsigned)
PCP:  Pcp, No   No chief complaint on file.    HPI:      Ms. SHAWN Park is a 21 y.o. No obstetric history on file. who LMP was No LMP recorded., presents today for her annual examination.  Her menses are regular every 28-30 days, lasting 3-4 days.  Dysmenorrhea mild, greatly improved with OCPs.  She does not have intermenstrual bleeding.  Sex activity: currently sexually active--Contraception OCPs. Last Pap: 12/19/19  Results were normal Hx of STDs: none  There is no FH of breast cancer. There is no FH of ovarian cancer. The patient does do self-breast exams.  Tobacco use: The patient denies current or previous tobacco use. Alcohol use: none No drug use.  Exercise: moderately active  She does get adequate calcium but not Vitamin D in her diet. Thinks Gardasil done. Having issues with external hemorrhoid. Notices bleeding after hard BM rarely; sometimes painful with BM. Has increased water and eating healthier with no sx change.   Past Medical History:  Diagnosis Date   Acne     Past Surgical History:  Procedure Laterality Date   GUM SURGERY     TONSILLECTOMY AND ADENOIDECTOMY      Family History  Problem Relation Age of Onset   Hypertension Maternal Grandfather    Diabetes Paternal Grandfather    Heart murmur Paternal Grandmother     Social History   Socioeconomic History   Marital status: Single    Spouse name: Not on file   Number of children: Not on file   Years of education: Not on file   Highest education level: Not on file  Occupational History   Not on file  Tobacco Use   Smoking status: Never   Smokeless tobacco: Never  Vaping Use   Vaping Use: Never used  Substance and Sexual Activity   Alcohol use: No    Alcohol/week: 0.0 standard drinks of alcohol   Drug use: No   Sexual activity: Yes    Birth control/protection: Pill  Other Topics Concern   Not on file  Social History Narrative   Not on file   Social Determinants of Health    Financial Resource Strain: Not on file  Food Insecurity: Not on file  Transportation Needs: Not on file  Physical Activity: Not on file  Stress: Not on file  Social Connections: Not on file  Intimate Partner Violence: Not on file    Outpatient Medications Prior to Visit  Medication Sig Dispense Refill   fluconazole (DIFLUCAN) 150 MG tablet Take 1 tablet (150 mg total) by mouth every 3 (three) days as needed. 2 tablet 0   fluticasone (FLONASE) 50 MCG/ACT nasal spray Place into both nostrils.     norethindrone-ethinyl estradiol-iron (LARIN FE 1.5/30) 1.5-30 MG-MCG tablet Take 1 tablet by mouth daily. 84 tablet 3   No facility-administered medications prior to visit.    ROS:  Review of Systems  Constitutional:  Negative for fatigue, fever and unexpected weight change.  Respiratory:  Negative for cough, shortness of breath and wheezing.   Cardiovascular:  Negative for chest pain, palpitations and leg swelling.  Gastrointestinal:  Positive for constipation and rectal pain. Negative for blood in stool, diarrhea, nausea and vomiting.  Endocrine: Negative for cold intolerance, heat intolerance and polyuria.  Genitourinary:  Negative for dyspareunia, dysuria, flank pain, frequency, genital sores, hematuria, menstrual problem, pelvic pain, urgency, vaginal bleeding, vaginal discharge and vaginal pain.  Musculoskeletal:  Negative for back pain, joint swelling and  myalgias.  Skin:  Negative for rash.  Neurological:  Negative for dizziness, syncope, light-headedness, numbness and headaches.  Hematological:  Negative for adenopathy.  Psychiatric/Behavioral:  Negative for agitation, confusion, sleep disturbance and suicidal ideas. The patient is not nervous/anxious.    BREAST: No symptoms   Objective: There were no vitals taken for this visit.   Physical Exam Constitutional:      Appearance: She is well-developed.  Genitourinary:     Vulva normal.     Right Labia: No rash,  tenderness or lesions.    Left Labia: No tenderness, lesions or rash.    No vaginal discharge, erythema or tenderness.      Right Adnexa: not tender and no mass present.    Left Adnexa: not tender and no mass present.    No cervical motion tenderness, friability or polyp.     Uterus is not enlarged or tender.  Rectum:     External hemorrhoid present.  Breasts:    Right: No mass, nipple discharge, skin change or tenderness.     Left: No mass, nipple discharge, skin change or tenderness.  Neck:     Thyroid: No thyromegaly.  Cardiovascular:     Rate and Rhythm: Normal rate and regular rhythm.     Heart sounds: Normal heart sounds. No murmur heard. Pulmonary:     Effort: Pulmonary effort is normal.     Breath sounds: Normal breath sounds.  Abdominal:     Palpations: Abdomen is soft.     Tenderness: There is no abdominal tenderness. There is no guarding or rebound.  Musculoskeletal:        General: Normal range of motion.     Cervical back: Normal range of motion.  Lymphadenopathy:     Cervical: No cervical adenopathy.  Neurological:     General: No focal deficit present.     Mental Status: She is alert and oriented to person, place, and time.     Cranial Nerves: No cranial nerve deficit.  Skin:    General: Skin is warm and dry.  Psychiatric:        Mood and Affect: Mood normal.        Behavior: Behavior normal.        Thought Content: Thought content normal.        Judgment: Judgment normal.  Vitals reviewed.     Assessment/Plan: Encounter for annual routine gynecological examination  Screening for STD (sexually transmitted disease) - Plan: Cervicovaginal ancillary only  Encounter for surveillance of contraceptive pills - Plan: norethindrone-ethinyl estradiol-iron (LARIN FE 1.5/30) 1.5-30 MG-MCG tablet; OCP RF  Dysmenorrhea in adolescent - Plan: norethindrone-ethinyl estradiol-iron (LARIN FE 1.5/30) 1.5-30 MG-MCG tablet  External hemorrhoid--small hemorrhoid tag on  exam; increase fiber/water, can add colace prn. F/u prn.   Slow transit constipation   No orders of the defined types were placed in this encounter.             GYN counsel adequate intake of calcium and vitamin D, diet and exercise    F/U  No follow-ups on file.  Marie Berko B. Kherington Meraz, PA-C 01/03/2022 7:30 PM

## 2022-01-04 ENCOUNTER — Ambulatory Visit (INDEPENDENT_AMBULATORY_CARE_PROVIDER_SITE_OTHER): Payer: BC Managed Care – PPO | Admitting: Obstetrics and Gynecology

## 2022-01-04 ENCOUNTER — Encounter: Payer: Self-pay | Admitting: Obstetrics and Gynecology

## 2022-01-04 ENCOUNTER — Other Ambulatory Visit (HOSPITAL_COMMUNITY)
Admission: RE | Admit: 2022-01-04 | Discharge: 2022-01-04 | Disposition: A | Discharge status: Home / Self Care | Payer: BC Managed Care – PPO | Source: Ambulatory Visit | Attending: Obstetrics and Gynecology | Admitting: Obstetrics and Gynecology

## 2022-01-04 VITALS — BP 90/60 | Ht 68.0 in | Wt 119.0 lb

## 2022-01-04 DIAGNOSIS — Z113 Encounter for screening for infections with a predominantly sexual mode of transmission: Secondary | ICD-10-CM | POA: Insufficient documentation

## 2022-01-04 DIAGNOSIS — Z3041 Encounter for surveillance of contraceptive pills: Secondary | ICD-10-CM | POA: Diagnosis not present

## 2022-01-04 DIAGNOSIS — N946 Dysmenorrhea, unspecified: Secondary | ICD-10-CM

## 2022-01-04 DIAGNOSIS — Z01419 Encounter for gynecological examination (general) (routine) without abnormal findings: Secondary | ICD-10-CM | POA: Diagnosis not present

## 2022-01-04 DIAGNOSIS — K644 Residual hemorrhoidal skin tags: Secondary | ICD-10-CM

## 2022-01-04 DIAGNOSIS — Z124 Encounter for screening for malignant neoplasm of cervix: Secondary | ICD-10-CM

## 2022-01-04 MED ORDER — NORETHIN ACE-ETH ESTRAD-FE 1.5-30 MG-MCG PO TABS: 1.0000 | ORAL_TABLET | Freq: Every day | ORAL | 3 refills | Status: AC

## 2022-01-04 NOTE — Patient Instructions (Signed)
I value your feedback and you entrusting us with your care. If you get a [facility] patient survey, I would appreciate you taking the time to let us know about your experience today. Thank you! ? ? ?

## 2022-01-05 LAB — CERVICOVAGINAL ANCILLARY ONLY
Chlamydia: NEGATIVE
Comment: NEGATIVE
Comment: NORMAL
Neisseria Gonorrhea: NEGATIVE

## 2022-09-07 NOTE — Telephone Encounter (Signed)
Error
